# Patient Record
Sex: Female | Born: 1960 | Race: Black or African American | Hispanic: No | Marital: Married | Smoking: Never smoker
Health system: Southern US, Community
[De-identification: ages and names within clinical notes are randomized; demographics above are authoritative.]

## PROBLEM LIST (undated history)

## (undated) DIAGNOSIS — R251 Tremor, unspecified: Secondary | ICD-10-CM

## (undated) DIAGNOSIS — I10 Essential (primary) hypertension: Secondary | ICD-10-CM

## (undated) DIAGNOSIS — E119 Type 2 diabetes mellitus without complications: Secondary | ICD-10-CM

## (undated) HISTORY — PX: CHOLECYSTECTOMY: SHX55

## (undated) HISTORY — PX: TONSILLECTOMY: SUR1361

## (undated) HISTORY — DX: Tremor, unspecified: R25.1

## (undated) HISTORY — PX: BREAST EXCISIONAL BIOPSY: SUR124

## (undated) HISTORY — PX: ABDOMINAL HYSTERECTOMY: SHX81

---

## 2000-02-11 ENCOUNTER — Encounter (INDEPENDENT_AMBULATORY_CARE_PROVIDER_SITE_OTHER): Payer: Self-pay | Admitting: Specialist

## 2000-02-11 ENCOUNTER — Ambulatory Visit (HOSPITAL_COMMUNITY): Admission: RE | Admit: 2000-02-11 | Discharge: 2000-02-11 | Payer: Self-pay | Admitting: *Deleted

## 2000-02-16 ENCOUNTER — Encounter: Payer: Self-pay | Admitting: *Deleted

## 2000-02-16 ENCOUNTER — Encounter: Admission: RE | Admit: 2000-02-16 | Discharge: 2000-02-16 | Payer: Self-pay | Admitting: *Deleted

## 2000-03-15 ENCOUNTER — Encounter: Admission: RE | Admit: 2000-03-15 | Discharge: 2000-03-15 | Payer: Self-pay | Admitting: Nephrology

## 2000-03-15 ENCOUNTER — Encounter: Payer: Self-pay | Admitting: Nephrology

## 2000-04-07 ENCOUNTER — Encounter: Admission: RE | Admit: 2000-04-07 | Discharge: 2000-04-07 | Payer: Self-pay | Admitting: *Deleted

## 2000-04-07 ENCOUNTER — Encounter: Payer: Self-pay | Admitting: *Deleted

## 2000-07-24 ENCOUNTER — Encounter (INDEPENDENT_AMBULATORY_CARE_PROVIDER_SITE_OTHER): Payer: Self-pay | Admitting: Specialist

## 2000-07-24 ENCOUNTER — Ambulatory Visit (HOSPITAL_COMMUNITY): Admission: RE | Admit: 2000-07-24 | Discharge: 2000-07-24 | Payer: Self-pay | Admitting: *Deleted

## 2000-08-14 ENCOUNTER — Encounter: Payer: Self-pay | Admitting: *Deleted

## 2000-08-14 ENCOUNTER — Ambulatory Visit (HOSPITAL_COMMUNITY): Admission: RE | Admit: 2000-08-14 | Discharge: 2000-08-14 | Payer: Self-pay | Admitting: *Deleted

## 2000-11-22 ENCOUNTER — Emergency Department (HOSPITAL_COMMUNITY): Admission: EM | Admit: 2000-11-22 | Discharge: 2000-11-22 | Payer: Self-pay | Admitting: Emergency Medicine

## 2001-06-22 ENCOUNTER — Encounter: Payer: Self-pay | Admitting: Family Medicine

## 2001-06-22 ENCOUNTER — Encounter: Admission: RE | Admit: 2001-06-22 | Discharge: 2001-06-22 | Payer: Self-pay | Admitting: Family Medicine

## 2002-03-06 ENCOUNTER — Encounter (HOSPITAL_BASED_OUTPATIENT_CLINIC_OR_DEPARTMENT_OTHER): Payer: Self-pay | Admitting: General Surgery

## 2002-03-12 ENCOUNTER — Ambulatory Visit (HOSPITAL_COMMUNITY): Admission: RE | Admit: 2002-03-12 | Discharge: 2002-03-12 | Payer: Self-pay | Admitting: General Surgery

## 2002-03-12 ENCOUNTER — Encounter (INDEPENDENT_AMBULATORY_CARE_PROVIDER_SITE_OTHER): Payer: Self-pay | Admitting: *Deleted

## 2002-08-22 ENCOUNTER — Encounter: Payer: Self-pay | Admitting: Family Medicine

## 2002-08-22 ENCOUNTER — Encounter: Admission: RE | Admit: 2002-08-22 | Discharge: 2002-08-22 | Payer: Self-pay | Admitting: Family Medicine

## 2003-09-03 ENCOUNTER — Emergency Department (HOSPITAL_COMMUNITY): Admission: EM | Admit: 2003-09-03 | Discharge: 2003-09-03 | Payer: Self-pay | Admitting: Emergency Medicine

## 2003-09-11 ENCOUNTER — Encounter: Admission: RE | Admit: 2003-09-11 | Discharge: 2003-09-11 | Payer: Self-pay | Admitting: General Surgery

## 2003-11-20 ENCOUNTER — Encounter: Admission: RE | Admit: 2003-11-20 | Discharge: 2003-11-20 | Payer: Self-pay | Admitting: Family Medicine

## 2003-11-20 ENCOUNTER — Other Ambulatory Visit: Admission: RE | Admit: 2003-11-20 | Discharge: 2003-11-20 | Payer: Self-pay | Admitting: *Deleted

## 2003-12-03 ENCOUNTER — Emergency Department (HOSPITAL_COMMUNITY): Admission: EM | Admit: 2003-12-03 | Discharge: 2003-12-03 | Payer: Self-pay | Admitting: Emergency Medicine

## 2004-02-19 ENCOUNTER — Ambulatory Visit: Payer: Self-pay | Admitting: Oncology

## 2004-03-18 ENCOUNTER — Ambulatory Visit (HOSPITAL_COMMUNITY): Admission: RE | Admit: 2004-03-18 | Discharge: 2004-03-18 | Payer: Self-pay | Admitting: Oncology

## 2004-04-19 ENCOUNTER — Inpatient Hospital Stay (HOSPITAL_COMMUNITY): Admission: RE | Admit: 2004-04-19 | Discharge: 2004-04-22 | Payer: Self-pay | Admitting: Obstetrics

## 2004-04-19 ENCOUNTER — Encounter (INDEPENDENT_AMBULATORY_CARE_PROVIDER_SITE_OTHER): Payer: Self-pay | Admitting: Specialist

## 2004-07-07 ENCOUNTER — Ambulatory Visit: Payer: Self-pay | Admitting: Oncology

## 2005-03-09 ENCOUNTER — Ambulatory Visit: Payer: Self-pay | Admitting: Oncology

## 2005-06-03 ENCOUNTER — Encounter: Admission: RE | Admit: 2005-06-03 | Discharge: 2005-06-03 | Payer: Self-pay | Admitting: General Surgery

## 2005-12-06 ENCOUNTER — Ambulatory Visit: Payer: Self-pay | Admitting: Oncology

## 2005-12-08 LAB — CBC WITH DIFFERENTIAL/PLATELET
Basophils Absolute: 0 10*3/uL (ref 0.0–0.1)
EOS%: 1.4 % (ref 0.0–7.0)
Eosinophils Absolute: 0.1 10*3/uL (ref 0.0–0.5)
HGB: 13.3 g/dL (ref 11.6–15.9)
LYMPH%: 25.6 % (ref 14.0–48.0)
MCH: 30.8 pg (ref 26.0–34.0)
MCV: 87.9 fL (ref 81.0–101.0)
MONO%: 6.6 % (ref 0.0–13.0)
NEUT%: 66 % (ref 39.6–76.8)
Platelets: 275 10*3/uL (ref 145–400)
RDW: 13.1 % (ref 11.3–14.5)

## 2005-12-13 LAB — PROTEIN ELECTROPHORESIS, SERUM
Albumin ELP: 47.2 % — ABNORMAL LOW (ref 55.8–66.1)
Alpha-1-Globulin: 4.1 % (ref 2.9–4.9)
Alpha-2-Globulin: 8.5 % (ref 7.1–11.8)
Beta 2: 6.6 % — ABNORMAL HIGH (ref 3.2–6.5)
Gamma Globulin: 27.1 % — ABNORMAL HIGH (ref 11.1–18.8)

## 2005-12-13 LAB — COMPREHENSIVE METABOLIC PANEL
AST: 15 U/L (ref 0–37)
Alkaline Phosphatase: 69 U/L (ref 39–117)
BUN: 16 mg/dL (ref 6–23)
Creatinine, Ser: 0.91 mg/dL (ref 0.40–1.20)
Glucose, Bld: 177 mg/dL — ABNORMAL HIGH (ref 70–99)
Total Bilirubin: 0.4 mg/dL (ref 0.3–1.2)

## 2006-01-31 ENCOUNTER — Ambulatory Visit: Payer: Self-pay | Admitting: Oncology

## 2006-06-20 ENCOUNTER — Encounter: Admission: RE | Admit: 2006-06-20 | Discharge: 2006-06-20 | Payer: Self-pay | Admitting: Oncology

## 2006-07-31 ENCOUNTER — Ambulatory Visit: Payer: Self-pay | Admitting: Oncology

## 2006-08-08 ENCOUNTER — Encounter: Admission: RE | Admit: 2006-08-08 | Discharge: 2006-11-06 | Payer: Self-pay | Admitting: Family Medicine

## 2007-02-09 ENCOUNTER — Emergency Department (HOSPITAL_COMMUNITY): Admission: EM | Admit: 2007-02-09 | Discharge: 2007-02-09 | Payer: Self-pay | Admitting: Emergency Medicine

## 2007-11-04 ENCOUNTER — Emergency Department (HOSPITAL_COMMUNITY): Admission: EM | Admit: 2007-11-04 | Discharge: 2007-11-04 | Payer: Self-pay | Admitting: Family Medicine

## 2008-07-17 ENCOUNTER — Emergency Department (HOSPITAL_COMMUNITY): Admission: EM | Admit: 2008-07-17 | Discharge: 2008-07-17 | Payer: Self-pay | Admitting: Family Medicine

## 2008-11-20 ENCOUNTER — Emergency Department (HOSPITAL_COMMUNITY): Admission: EM | Admit: 2008-11-20 | Discharge: 2008-11-20 | Payer: Self-pay | Admitting: Emergency Medicine

## 2009-01-08 ENCOUNTER — Ambulatory Visit: Payer: Self-pay | Admitting: Oncology

## 2009-02-04 LAB — SPEP & IFE WITH QIG
Albumin ELP: 45 % — ABNORMAL LOW (ref 55.8–66.1)
Alpha-2-Globulin: 9.3 % (ref 7.1–11.8)
Beta 2: 6.6 % — ABNORMAL HIGH (ref 3.2–6.5)
Beta Globulin: 6.7 % (ref 4.7–7.2)
IgA: 502 mg/dL — ABNORMAL HIGH (ref 68–378)
IgG (Immunoglobin G), Serum: 2180 mg/dL — ABNORMAL HIGH (ref 694–1618)

## 2009-02-04 LAB — KAPPA/LAMBDA LIGHT CHAINS
Kappa free light chain: 1.05 mg/dL (ref 0.33–1.94)
Lambda Free Lght Chn: 4.35 mg/dL — ABNORMAL HIGH (ref 0.57–2.63)

## 2009-02-26 ENCOUNTER — Ambulatory Visit: Payer: Self-pay | Admitting: Oncology

## 2009-03-11 LAB — UIFE/LIGHT CHAINS/TP QN, 24-HR UR
Albumin, U: DETECTED
Alpha 1, Urine: DETECTED — AB
Beta, Urine: DETECTED — AB
Free Lambda Excretion/Day: 34.19 mg/d
Gamma Globulin, Urine: DETECTED — AB

## 2009-09-15 ENCOUNTER — Encounter: Admission: RE | Admit: 2009-09-15 | Discharge: 2009-09-15 | Payer: Self-pay | Admitting: Family Medicine

## 2009-12-09 ENCOUNTER — Other Ambulatory Visit: Admission: RE | Admit: 2009-12-09 | Discharge: 2009-12-09 | Payer: Self-pay | Admitting: Family Medicine

## 2010-01-28 ENCOUNTER — Ambulatory Visit: Payer: Self-pay | Admitting: Oncology

## 2010-02-01 LAB — CBC WITH DIFFERENTIAL/PLATELET
BASO%: 0.2 % (ref 0.0–2.0)
EOS%: 2 % (ref 0.0–7.0)
Eosinophils Absolute: 0.2 10*3/uL (ref 0.0–0.5)
HGB: 13.2 g/dL (ref 11.6–15.9)
LYMPH%: 37.2 % (ref 14.0–49.7)
MCH: 30.7 pg (ref 25.1–34.0)
MCHC: 35.3 g/dL (ref 31.5–36.0)
MCV: 87 fL (ref 79.5–101.0)
MONO#: 0.8 10*3/uL (ref 0.1–0.9)
MONO%: 8.8 % (ref 0.0–14.0)
NEUT#: 4.7 10*3/uL (ref 1.5–6.5)
NEUT%: 51.8 % (ref 38.4–76.8)
Platelets: 266 10*3/uL (ref 145–400)
RBC: 4.29 10*6/uL (ref 3.70–5.45)
RDW: 14.5 % (ref 11.2–14.5)
WBC: 9.1 10*3/uL (ref 3.9–10.3)
lymph#: 3.4 10*3/uL — ABNORMAL HIGH (ref 0.9–3.3)

## 2010-02-03 LAB — KAPPA/LAMBDA LIGHT CHAINS
Kappa free light chain: 1.08 mg/dL (ref 0.33–1.94)
Lambda Free Lght Chn: 4.19 mg/dL — ABNORMAL HIGH (ref 0.57–2.63)

## 2010-02-03 LAB — PROTEIN ELECTROPHORESIS, SERUM
Albumin ELP: 45.7 % — ABNORMAL LOW (ref 55.8–66.1)
Alpha-1-Globulin: 6.5 % — ABNORMAL HIGH (ref 2.9–4.9)
Alpha-2-Globulin: 8.5 % (ref 7.1–11.8)
Beta 2: 5.2 % (ref 3.2–6.5)
Beta Globulin: 6.4 % (ref 4.7–7.2)
M-Spike, %: 1.63 g/dL

## 2010-07-17 LAB — DIFFERENTIAL
Basophils Absolute: 0.1 10*3/uL (ref 0.0–0.1)
Basophils Relative: 1 % (ref 0–1)
Eosinophils Absolute: 0.1 10*3/uL (ref 0.0–0.7)
Eosinophils Relative: 1 % (ref 0–5)
Lymphocytes Relative: 37 % (ref 12–46)
Lymphs Abs: 3.7 10*3/uL (ref 0.7–4.0)
Monocytes Absolute: 0.5 10*3/uL (ref 0.1–1.0)
Monocytes Relative: 5 % (ref 3–12)
Neutro Abs: 5.5 10*3/uL (ref 1.7–7.7)
Neutrophils Relative %: 55 % (ref 43–77)

## 2010-07-17 LAB — URINALYSIS, ROUTINE W REFLEX MICROSCOPIC
Bilirubin Urine: NEGATIVE
Glucose, UA: >1000 mg/dL — AB
Hgb urine dipstick: NEGATIVE
Ketones, ur: NEGATIVE mg/dL
Leukocytes, UA: NEGATIVE
Nitrite: NEGATIVE
Protein, ur: 100 mg/dL — AB
Specific Gravity, Urine: 1.039 — ABNORMAL HIGH (ref 1.005–1.030)
Urobilinogen, UA: 0.2 mg/dL (ref 0.0–1.0)
pH: 6.5 (ref 5.0–8.0)

## 2010-07-17 LAB — COMPREHENSIVE METABOLIC PANEL
ALT: 26 U/L (ref 0–35)
AST: 21 U/L (ref 0–37)
Albumin: 3.2 g/dL — ABNORMAL LOW (ref 3.5–5.2)
Alkaline Phosphatase: 102 U/L (ref 39–117)
BUN: 12 mg/dL (ref 6–23)
CO2: 26 mEq/L (ref 19–32)
Calcium: 8.6 mg/dL (ref 8.4–10.5)
Chloride: 101 mEq/L (ref 96–112)
Creatinine, Ser: 0.72 mg/dL (ref 0.4–1.2)
GFR calc Af Amer: >60 mL/min (ref >60)
GFR calc non Af Amer: >60 mL/min (ref >60)
Glucose, Bld: 349 mg/dL — ABNORMAL HIGH (ref 70–99)
Potassium: 3.6 mEq/L (ref 3.5–5.1)
Sodium: 133 mEq/L — ABNORMAL LOW (ref 135–145)
Total Bilirubin: 0.6 mg/dL (ref 0.3–1.2)
Total Protein: 7.6 g/dL (ref 6.0–8.3)

## 2010-07-17 LAB — URINE MICROSCOPIC-ADD ON

## 2010-07-17 LAB — CBC
HCT: 43.5 % (ref 36.0–46.0)
Hemoglobin: 15.3 g/dL — ABNORMAL HIGH (ref 12.0–15.0)
MCHC: 35.2 g/dL (ref 30.0–36.0)
MCV: 86.4 fL (ref 78.0–100.0)
Platelets: 213 10*3/uL (ref 150–400)
RBC: 5.04 MIL/uL (ref 3.87–5.11)
RDW: 13.3 % (ref 11.5–15.5)
WBC: 10.1 10*3/uL (ref 4.0–10.5)

## 2010-07-17 LAB — GLUCOSE, CAPILLARY
Glucose-Capillary: 340 mg/dL — ABNORMAL HIGH (ref 70–99)
Glucose-Capillary: 376 mg/dL — ABNORMAL HIGH (ref 70–99)

## 2010-08-18 ENCOUNTER — Emergency Department (HOSPITAL_BASED_OUTPATIENT_CLINIC_OR_DEPARTMENT_OTHER)
Admission: EM | Admit: 2010-08-18 | Discharge: 2010-08-18 | Disposition: A | Discharge status: Other Acute Inpatient Hospital | Payer: Managed Care, Other (non HMO) | Attending: Emergency Medicine | Admitting: Emergency Medicine

## 2010-08-18 ENCOUNTER — Emergency Department (HOSPITAL_COMMUNITY)
Admission: EM | Admit: 2010-08-18 | Discharge: 2010-08-19 | Disposition: A | Discharge status: Home / Self Care | Payer: Managed Care, Other (non HMO) | Attending: Emergency Medicine | Admitting: Emergency Medicine

## 2010-08-18 ENCOUNTER — Emergency Department (INDEPENDENT_AMBULATORY_CARE_PROVIDER_SITE_OTHER): Payer: Managed Care, Other (non HMO)

## 2010-08-18 DIAGNOSIS — E119 Type 2 diabetes mellitus without complications: Secondary | ICD-10-CM | POA: Insufficient documentation

## 2010-08-18 DIAGNOSIS — R079 Chest pain, unspecified: Secondary | ICD-10-CM | POA: Insufficient documentation

## 2010-08-18 DIAGNOSIS — I10 Essential (primary) hypertension: Secondary | ICD-10-CM | POA: Insufficient documentation

## 2010-08-18 DIAGNOSIS — Z79899 Other long term (current) drug therapy: Secondary | ICD-10-CM | POA: Insufficient documentation

## 2010-08-18 LAB — CBC
HCT: 36.2 % (ref 36.0–46.0)
Hemoglobin: 13.3 g/dL (ref 12.0–15.0)
MCH: 29.2 pg (ref 26.0–34.0)
MCHC: 36.7 g/dL — ABNORMAL HIGH (ref 30.0–36.0)
MCV: 79.6 fL (ref 78.0–100.0)
Platelets: 293 10*3/uL (ref 150–400)
RBC: 4.55 MIL/uL (ref 3.87–5.11)
RDW: 13.8 % (ref 11.5–15.5)

## 2010-08-18 LAB — DIFFERENTIAL
Basophils Absolute: 0 10*3/uL (ref 0.0–0.1)
Eosinophils Absolute: 0.2 10*3/uL (ref 0.0–0.7)
Eosinophils Relative: 2 % (ref 0–5)
Lymphocytes Relative: 33 % (ref 12–46)
Lymphs Abs: 3.1 10*3/uL (ref 0.7–4.0)
Monocytes Absolute: 1 10*3/uL (ref 0.1–1.0)
Monocytes Relative: 10 % (ref 3–12)
Neutro Abs: 5.3 10*3/uL (ref 1.7–7.7)
Neutrophils Relative %: 56 % (ref 43–77)

## 2010-08-18 LAB — CK TOTAL AND CKMB (NOT AT ARMC)
CK, MB: 2.3 ng/mL (ref 0.3–4.0)
CK, MB: 2.3 ng/mL (ref 0.3–4.0)
Relative Index: 2 (ref 0.0–2.5)
Relative Index: 2.2 (ref 0.0–2.5)
Total CK: 116 U/L (ref 7–177)

## 2010-08-18 LAB — BASIC METABOLIC PANEL
BUN: 13 mg/dL (ref 6–23)
CO2: 25 mEq/L (ref 19–32)
Calcium: 9.9 mg/dL (ref 8.4–10.5)
Chloride: 97 mEq/L (ref 96–112)
Creatinine, Ser: 0.8 mg/dL (ref 0.4–1.2)
GFR calc Af Amer: >60 mL/min (ref >60)
GFR calc non Af Amer: >60 mL/min (ref >60)
Glucose, Bld: 169 mg/dL — ABNORMAL HIGH (ref 70–99)
Potassium: 3.4 mEq/L — ABNORMAL LOW (ref 3.5–5.1)
Sodium: 136 mEq/L (ref 135–145)

## 2010-08-18 LAB — D-DIMER, QUANTITATIVE: D-Dimer, Quant: <0.22 ug/mL-FEU (ref 0.00–0.48)

## 2010-08-18 LAB — TROPONIN I: Troponin I: <0.3 ng/mL (ref <0.30)

## 2010-08-19 DIAGNOSIS — R072 Precordial pain: Secondary | ICD-10-CM

## 2010-08-19 LAB — CK TOTAL AND CKMB (NOT AT ARMC)
CK, MB: 2.1 ng/mL (ref 0.3–4.0)
Total CK: 105 U/L (ref 7–177)

## 2010-08-27 NOTE — Discharge Summary (Signed)
Courtney Cabrera, Courtney Cabrera          ACCOUNT NO.:  [id number]   MEDICAL RECORD NO.:  [id number]          PATIENT TYPE:  INP   LOCATION:  9308                          FACILITY:  [facility]   PHYSICIAN:  Roseanna Rainbow, M.D.DATE OF BIRTH:  Nov 28, 1960   DATE OF ADMISSION:  04/19/2004  DATE OF DISCHARGE:  04/22/2004                                 DISCHARGE SUMMARY   CHIEF COMPLAINT:  The patient is a 50 year old African-American female, para  2, with dysmenorrhea, menorrhagia, for a total abdominal hysterectomy.   HISTORY OF PRESENT ILLNESS:  The patient gives a history of the above  complaints for approximately one year.  She also has a history of abnormal  Pap smear.  Her symptoms have been resistant to attempts at medical  management.   ALLERGIES:  No known drug allergies.   MEDICATIONS:  1.  Diovan.  2.  Ibuprofen.  3.  Tylenol.   PAST SURGICAL HISTORY:  Cesarean delivery x2.  Bilateral tubal ligation.  Cholecystectomy.   PAST MEDICAL HISTORY:  Chronic hypertension.   FAMILY HISTORY:  Coronary vascular disease, CVA, breast cancer.   SOCIAL HISTORY:  She is married.  She denies any tobacco, ethanol, or  recreational drug use.   PHYSICAL EXAMINATION:  VITAL SIGNS:  Temperature 97.6, pulse 86, respiratory  rate 16, blood pressure 151/86, weight 214 pounds, height 5 feet 1 inch.  GENERAL:  Well-nourished, well-developed, African-American female in no  acute distress.  HEENT:  Normocephalic and atraumatic.  NECK:  Supple, no adenopathy.  LUNGS:  Clear to auscultation bilaterally.  HEART:  Regular rate and rhythm.  BREASTS:  Soft, no masses, nontender.  ABDOMEN:  Obese, nontender, no masses. Adenopathy; none.  PELVIC:  External female genitalia normal appearing.  Speculum examination;  the cervix is nulliparous, no lesions.  The vagina is clean.  On bimanual  examination; the uterus is 10 to 12 weeks aggregate size, midposition,  tender to palpation. The adnexa; no masses,  nontender.  EXTREMITIES:  No cyanosis, clubbing, or edema.  SKIN:  Without rash.   ASSESSMENT:  Symptomatic uterine fibroids.   PLAN:  Total abdominal hysterectomy.   HOSPITAL COURSE:  The patient was admitted and underwent a total abdominal  hysterectomy with lysis of adhesions.  Please see the dictated operative  summary for further details.  Her postoperative course was uneventful.  She  was discharged to home on postoperative day #3 tolerating a regular diet.   DISCHARGE DIAGNOSES:  1.  Endometrial polyp.  2.  Uterine fibroids.   PROCEDURE:  Total abdominal hysterectomy and lysis of adhesions.   CONDITION ON DISCHARGE:  Good.   DISCHARGE MEDICATIONS:  1.  Ibuprofen.  2.  Darvocet one to two tablets every four to six hours as needed.   ACTIVITY:  Pelvic rest, no strenuous activity.   DIET:  Regular.   DISPOSITION:  The patient is to follow up in several days for staple  removal.      LAJ/MEDQ  D:  06/01/2004  T:  06/01/2004  Job:  [id number]

## 2010-08-27 NOTE — Op Note (Signed)
   Courtney Cabrera, Courtney Cabrera                    ACCOUNT NO.:  [id number]   MEDICAL RECORD NO.:  [id number]                   PATIENT TYPE:  OIB   LOCATION:  2874                                 FACILITY:  [facility]   PHYSICIAN:  Leonie Man, M.D.                DATE OF BIRTH:  Sep 30, 1960   DATE OF PROCEDURE:  03/12/2002  DATE OF DISCHARGE:                                 OPERATIVE REPORT   PREOPERATIVE DIAGNOSIS:  Right breast mass, rule out carcinoma.   POSTOPERATIVE DIAGNOSIS:  Right breast mass, rule out carcinoma, pathology  pending.   OPERATION PERFORMED:  Excisional biopsy right breast mass.   SURGEON:  Leonie Man, M.D.   ASSISTANT:  Nurse.   ANESTHESIA:  General.   INDICATIONS FOR PROCEDURE:  The patient is a 50 year old woman with a  persistent right breast mass that is located at the anterior axillary line  which does not image on ultrasound or on mammogram.  It has been persistent  for some time.  The patient wishes to have this removed.  She comes to the  operating room  now after the risks and potential benefits of surgery have  been fully discussed with her, all questions answered and consent obtained.   DESCRIPTION OF PROCEDURE:  Following the induction of satisfactory general  anesthesia, the patient was positioned supinely.  The right breast was  prepped and draped to be included in a sterile operative field.  The region  of the anterior axillary line of the right breast near the axillary tail was  then palpated.  The entire circumareolar incision was carried down through  the skin and subcutaneous tissues, deepened to the region of the mass.  The  mass was excised in its entirety, carrying the entire excision all the way  down to the chest wall.  This was removed and forwarded for pathologic  evaluation.  Hemostasis was obtained with electrocautery.  Sponge and  instrument counts were verified.  The subcutaneous tissues were closed with  interrupted 3-0  Vicryl sutures and the skin was closed with a running 5-0  Monocryl, then reinforced with Steri-Strips.  Sterile dressings were  applied.  Anesthetic was reversed and the patient removed from the operating  room to the recovery room in stable condition, having tolerated the  procedure well.                                                Leonie Man, M.D.    PB/MEDQ  D:  03/12/2002  T:  03/12/2002  Job:  [id number]

## 2010-08-27 NOTE — Op Note (Signed)
NAMEDONATA, Courtney Cabrera          ACCOUNT NO.:  [id number]   MEDICAL RECORD NO.:  [id number]          PATIENT TYPE:  INP   LOCATION:  9399                          FACILITY:  [facility]   PHYSICIAN:  Charles A. Clearance Cabrera, M.D.DATE OF BIRTH:  1960/04/30   DATE OF PROCEDURE:  04/19/2004  DATE OF DISCHARGE:                                 OPERATIVE REPORT   PREOPERATIVE DIAGNOSIS:  Symptomatic uterine fibroids.   POSTOPERATIVE DIAGNOSIS:  Symptomatic uterine fibroids.   PROCEDURE:  1.  Total abdominal hysterectomy.  2.  Lysis of multiple pelvic adhesions.   SURGEON:  Coral Ceo, M.D.   ASSISTANT:  Antionette Char, M.D.   ANESTHESIA:  General.   ESTIMATED BLOOD LOSS:  250 mL.   IV FLUIDS:  2000 mL.   URINE OUT:  150 mL clear.   COMPLICATIONS:  None.  Foley to gravity.   SPECIMENS:  Uterus with cervix.   FINDINGS:  Multiple adhesions between uterus and peritoneum.  Fibroid  uterus.   OPERATION:  The patient was brought to the operating room.  After  satisfactory general endotracheal anesthesia, the abdomen was prepped and  draped in the usual sterile fashion.  A midline vertical skin incision was  made with the scalpel through the previous incision down to the fascia.  Fascia was nicked in the midline, and the fascial incision was extended  superiorly and inferiorly, sharply with the scalpel.  The center of the  rectus muscle was then sharply dissected off of the fascia, and the rectus  muscle was divided in the midline sharply.  The peritoneum was entered, and  the peritoneum was extended superiorly and inferiorly being careful to avoid  the urinary bladder anteriorly.  A dense adhesion was noted between the  fundus of the uterus and the fold of peritoneum above the reflection of the  urinary bladder into the peritoneum.  This was sharply dissected close to  the uterus, and the peritoneum was dropped away from the fundus of the  uterus with sharp dissection.  The cornu of  the uterus along with the broad  and round ligaments were then grasped with Kelly forceps, and the uterus was  exteriorized after placement of the O'Connor-O'Sullivan retractor and  packing off of the bowel.  The anterior and posterior blades were also  placed, and then the long Kelly forceps were placed across the uteroovarian  and broad ligaments.  The round ligaments were transected bilaterally and  suture ligated with 0 Vicryl suture, and the anterior reflection of the  peritoneum was then transected, and the urinary bladder was dropped away  from the anterior lower uterine segment and the cervix.  The urinary bladder  was sharply dissected away from the cervix and pushed down away from the  operative field.  The peritoneum was then dissected sharply posteriorly back  to the uterosacral ligament.  A window was tented medially in the broad  ligament, and parametrial clamps were placed across the broad and  uteroovarian ligaments bilaterally, and these were transected and free tied  with 0 Vicryl to place beneath the clamp followed by transfixion suture of 0  Vicryl.  The uterine vessels were then skeletonized and cross-clamped at  right angle with parametrial clamps followed by straight parametrial clamp  above to prevent back flow.  The uterine vessels were then transected and  suture ligated with transfixion sutures of 0 Vicryl bilaterally.  The  cardinal ligaments were then clamped, transected, and suture ligated with  transfixion sutures of 0 Vicryl down to the uterosacral ligament.  The  uterosacral ligaments were cross-clamped, transected, and suture ligated  with transfixion sutures of 0 Vicryl bilaterally.  The curved parametrial  clamps were then placed across the vaginal cuff and met in the center, and  the cervix along with the uterus was then transected away from the vaginal  cuff and submitted to pathology for evaluation.  The corners of the vaginal  cuff were then suture  ligated with transfixion sutures of 0 Vicryl.  The  center of the vaginal cuff was closed with a figure-of-eight suture of 0  Vicryl.  Hemostasis was excellent.  The pelvic cavity was then thoroughly  irrigated with warm saline solution, and all pedicles were examined for  hemostasis, and there was no active bleeding noted.  The retractor was then  removed, and all packing was removed.  The surgical technician indicated  that counts were correct at this time.  The abdomen was then closes as  follows:  The fascia and peritoneum were closed as one with a continuous  suture of 0 PDS from each corner to the center; subcutaneous tissue was  thoroughly irrigated with warm saline solution, and all areas of  subcutaneous bleeding were coagulated with the Bovie.  The skin was then  closed with stainless steel staples.  Sterile bandages applied to the  incision closure.  The surgical technician indicated that needle, sponge,  and instrument counts were correct x 2.  The patient tolerated the procedure  well and was transported to the recovery room in satisfactory condition.                                               ______________________________  Courtney Cabrera, M.D.  Electronically Signed 05/03/2004 10:02:33 EST    CAH/MEDQ  D:  04/19/2004  T:  04/19/2004  Job:  [id number]

## 2011-04-25 ENCOUNTER — Telehealth: Payer: Self-pay | Admitting: Oncology

## 2011-04-25 NOTE — Telephone Encounter (Signed)
S/w the pt and she wants to see her regular doctor first to see if she  If she still needs to see dr Truett Perna before setting her up for an appt

## 2011-06-17 ENCOUNTER — Other Ambulatory Visit: Payer: Managed Care, Other (non HMO) | Admitting: Lab

## 2011-06-17 ENCOUNTER — Ambulatory Visit: Payer: Managed Care, Other (non HMO) | Admitting: Oncology

## 2012-07-02 ENCOUNTER — Encounter (HOSPITAL_COMMUNITY): Payer: Self-pay | Admitting: Emergency Medicine

## 2012-07-02 ENCOUNTER — Emergency Department (HOSPITAL_COMMUNITY)
Admission: EM | Admit: 2012-07-02 | Discharge: 2012-07-02 | Disposition: A | Discharge status: Home / Self Care | Payer: Managed Care, Other (non HMO) | Source: Home / Self Care | Attending: Family Medicine | Admitting: Family Medicine

## 2012-07-02 DIAGNOSIS — E162 Hypoglycemia, unspecified: Secondary | ICD-10-CM

## 2012-07-02 HISTORY — DX: Type 2 diabetes mellitus without complications: E11.9

## 2012-07-02 HISTORY — DX: Essential (primary) hypertension: I10

## 2012-07-02 NOTE — ED Provider Notes (Signed)
History     CSN: [id number]  Arrival date & time 07/02/12  1134   First MD Initiated Contact with Patient 07/02/12 1456      Chief Complaint  Patient presents with  . Hypertension    (Consider location/radiation/quality/duration/timing/severity/associated sxs/prior treatment) HPI Comments: Pt reports that for last 2 months has been having episodes of hypoglycemia.  Had 2 in last 3 days.  They happen mostly in the morning and are relieved by eating.  Pt reports she eats breakfast before taking meds and hasn't changed diet lately. No recent change in dose of meds. Has been trying to exercise more often.  Fasting blood sugars are 110-129 at the highest.  During today's hypoglycemic episode, ems was called and found her bp to be 200/150 despite pt having taken her meds today.  Pt feels significantly better now.   Patient is a 52 y.o. female presenting with hypertension. The history is provided by the patient and the spouse.  Hypertension This is a chronic problem. Episode onset: increased bp this morning at work. The problem occurs constantly. Progression since onset: higher than usual this morning. Pertinent negatives include no chest pain, no abdominal pain and no headaches. Nothing aggravates the symptoms. The symptoms are relieved by medications. The treatment provided significant relief.    Past Medical History  Diagnosis Date  . Diabetes mellitus without complication   . Hypertension     History reviewed. No pertinent past surgical history.  History reviewed. No pertinent family history.  History  Substance Use Topics  . Smoking status: Never Smoker   . Smokeless tobacco: Not on file  . Alcohol Use: No    OB History   Grav Para Term Preterm Abortions TAB SAB Ect Mult Living                  Review of Systems  Constitutional: Positive for activity change. Negative for fever, chills, diaphoresis and appetite change.       Has increased physical activity   Cardiovascular: Negative for chest pain.  Gastrointestinal: Negative for abdominal pain.  Endocrine:       Hypoglycemia this morning  Neurological: Negative for headaches.    Allergies  Review of patient's allergies indicates no known allergies.  Home Medications   Current Outpatient Rx  Name  Route  Sig  Dispense  Refill  . cholecalciferol (VITAMIN D) 1000 UNITS tablet   Oral   Take 1,000 Units by mouth daily.         . ferrous sulfate 325 (65 FE) MG tablet   Oral   Take 325 mg by mouth daily with breakfast.         . glimepiride (AMARYL) 4 MG tablet   Oral   Take 4 mg by mouth daily before breakfast.         . metFORMIN (GLUCOPHAGE) 1000 MG tablet   Oral   Take 1,000 mg by mouth 2 (two) times daily with a meal.         . Multiple Vitamins-Minerals (MULTIVITAMIN WITH MINERALS) tablet   Oral   Take 1 tablet by mouth daily.         . valsartan (DIOVAN) 160 MG tablet   Oral   Take 160 mg by mouth daily.         . vitamin B-12 (CYANOCOBALAMIN) 100 MCG tablet   Oral   Take 50 mcg by mouth daily.           BP 166/89  Pulse  77  Temp(Src) 98.5 F (36.9 C) (Oral)  Resp 22  SpO2 100%  Physical Exam  Constitutional: She is oriented to person, place, and time. She appears well-developed and well-nourished. No distress.  Cardiovascular: Normal rate and regular rhythm.   Pulmonary/Chest: Effort normal and breath sounds normal.  Neurological: She is alert and oriented to person, place, and time.    ED Course  Procedures (including critical care time)  Labs Reviewed - No data to display No results found.   1. Hypoglycemia       MDM  bp here in [facility] elevated but not critical.  Pt to f/u with pcp, in the meantime reduced glimepiride dose.  Pt reports taking metformin 3 pills twice a day, and glimepiride 2 pills every morning.  Pt to cut glimepiride dose in half and take only one every morning.  Pt to monitor bp and blood sugar and take to pcp on  f/u.          Cathlyn Parsons, NP 07/02/12 1506

## 2012-07-02 NOTE — ED Notes (Addendum)
Pt c/o high blood pressure this morning at work. Felt hypoglecemic. Called EMS and BP was 200/150. Took all medications this morning as scheduled. Blood sugar was 113 this morning, then 95 on recheck. No light headedness. No headaches. Felt jittery. Patient is alert and oriented. No signs of distress.

## 2012-07-04 NOTE — ED Provider Notes (Signed)
Medical screening examination/treatment/procedure(s) were performed by resident physician or non-physician practitioner and as supervising physician I was immediately available for consultation/collaboration.   Kipton Skillen DOUGLAS MD.   Le Fer[place] D Alina Gilkey, MD 07/04/12 2046 

## 2012-12-06 ENCOUNTER — Emergency Department (HOSPITAL_COMMUNITY)
Admission: EM | Admit: 2012-12-06 | Discharge: 2012-12-06 | Disposition: A | Discharge status: Home / Self Care | Payer: Managed Care, Other (non HMO) | Attending: Emergency Medicine | Admitting: Emergency Medicine

## 2012-12-06 ENCOUNTER — Encounter (HOSPITAL_COMMUNITY): Payer: Self-pay | Admitting: Emergency Medicine

## 2012-12-06 ENCOUNTER — Emergency Department (HOSPITAL_COMMUNITY)
Admission: EM | Admit: 2012-12-06 | Discharge: 2012-12-06 | Disposition: A | Discharge status: Hospice | Payer: Managed Care, Other (non HMO) | Source: Home / Self Care

## 2012-12-06 ENCOUNTER — Encounter (HOSPITAL_COMMUNITY): Payer: Self-pay | Admitting: *Deleted

## 2012-12-06 DIAGNOSIS — I10 Essential (primary) hypertension: Secondary | ICD-10-CM

## 2012-12-06 DIAGNOSIS — R209 Unspecified disturbances of skin sensation: Secondary | ICD-10-CM | POA: Insufficient documentation

## 2012-12-06 DIAGNOSIS — R42 Dizziness and giddiness: Secondary | ICD-10-CM | POA: Insufficient documentation

## 2012-12-06 DIAGNOSIS — E119 Type 2 diabetes mellitus without complications: Secondary | ICD-10-CM | POA: Insufficient documentation

## 2012-12-06 DIAGNOSIS — R51 Headache: Secondary | ICD-10-CM | POA: Insufficient documentation

## 2012-12-06 LAB — URINALYSIS, ROUTINE W REFLEX MICROSCOPIC
Glucose, UA: NEGATIVE mg/dL
Hgb urine dipstick: NEGATIVE
Specific Gravity, Urine: 1.008 (ref 1.005–1.030)
Urobilinogen, UA: 0.2 mg/dL (ref 0.0–1.0)
pH: 6 (ref 5.0–8.0)

## 2012-12-06 LAB — COMPREHENSIVE METABOLIC PANEL
ALT: 29 U/L (ref 0–35)
AST: 22 U/L (ref 0–37)
Alkaline Phosphatase: 88 U/L (ref 39–117)
Calcium: 9.7 mg/dL (ref 8.4–10.5)
Potassium: 3.2 mEq/L — ABNORMAL LOW (ref 3.5–5.1)
Sodium: 137 mEq/L (ref 135–145)
Total Protein: 8.9 g/dL — ABNORMAL HIGH (ref 6.0–8.3)

## 2012-12-06 LAB — CBC
HCT: 37.9 % (ref 36.0–46.0)
Hemoglobin: 13.8 g/dL (ref 12.0–15.0)
MCH: 29.4 pg (ref 26.0–34.0)
MCHC: 36.4 g/dL — ABNORMAL HIGH (ref 30.0–36.0)
MCV: 80.8 fL (ref 78.0–100.0)

## 2012-12-06 NOTE — ED Provider Notes (Signed)
CSN: [id number]     Arrival date & time 12/06/12  1328 History   First MD Initiated Contact with Patient 12/06/12 1458     Chief Complaint  Patient presents with  . Hypertension   (Consider location/radiation/quality/duration/timing/severity/associated sxs/prior Treatment) HPI Comments: 52 year old female with a history of hypertension and diabetes. She has been monitoring her blood pressures at home and say they have consistently been elevating. She call her PCP yesterday and was given hydrochlorothiazide 12.5 mg. She has taken one dose. She continues to monitor her blood pressure has noticed that her diastolic continues to rise. She did not call her PCP back today. She is complaining of a throbbing headache, dizziness, nausea prior to arrival and numbness in the left arm. Her expectations are that we managed her blood pressure lowering in the urgent care.   Past Medical History  Diagnosis Date  . Diabetes mellitus without complication   . Hypertension    History reviewed. No pertinent past surgical history. History reviewed. No pertinent family history. History  Substance Use Topics  . Smoking status: Never Smoker   . Smokeless tobacco: Not on file  . Alcohol Use: No   OB History   Grav Para Term Preterm Abortions TAB SAB Ect Mult Living                 Review of Systems  Constitutional: Positive for activity change. Negative for fever.  HENT: Negative for congestion, sore throat, trouble swallowing, neck pain, postnasal drip and sinus pressure.   Respiratory: Negative.   Cardiovascular: Negative.   Gastrointestinal: Positive for nausea. Negative for vomiting and abdominal pain.  Neurological: Positive for dizziness, numbness and headaches.    Allergies  Review of patient's allergies indicates no known allergies.  Home Medications   Current Outpatient Rx  Name  Route  Sig  Dispense  Refill  . cholecalciferol (VITAMIN D) 1000 UNITS tablet   Oral   Take 1,000 Units by  mouth daily.         . ferrous sulfate 325 (65 FE) MG tablet   Oral   Take 325 mg by mouth daily with breakfast.         . glimepiride (AMARYL) 4 MG tablet   Oral   Take 4 mg by mouth daily before breakfast.         . metFORMIN (GLUCOPHAGE) 1000 MG tablet   Oral   Take 1,000 mg by mouth 2 (two) times daily with a meal.         . Multiple Vitamins-Minerals (MULTIVITAMIN WITH MINERALS) tablet   Oral   Take 1 tablet by mouth daily.         . valsartan (DIOVAN) 160 MG tablet   Oral   Take 160 mg by mouth daily.         . vitamin B-12 (CYANOCOBALAMIN) 100 MCG tablet   Oral   Take 50 mcg by mouth daily.          BP 164/107  Pulse 74  Temp(Src) 98.1 F (36.7 C) (Oral)  Resp 18  SpO2 98% Physical Exam  Nursing note and vitals reviewed. Constitutional: She is oriented to person, place, and time. She appears well-developed and well-nourished. No distress.  Eyes: Conjunctivae and EOM are normal. Pupils are equal, round, and reactive to light.  Neck: Normal range of motion. Neck supple.  Cardiovascular: Normal rate, regular rhythm and normal heart sounds.   Pulmonary/Chest: Effort normal and breath sounds normal. No respiratory distress. She has  no wheezes. She has no rales.  Musculoskeletal: She exhibits no edema.  Neurological: She is alert and oriented to person, place, and time. No cranial nerve deficit.  Skin: Skin is warm and dry.  Psychiatric: She has a normal mood and affect.    ED Course  Procedures (including critical care time) Labs Review Labs Reviewed - No data to display Imaging Review No results found.  MDM   1. Hypertension, poor control    Transfer to the ED for uncontrolled HTN assoc with nausea, headache and L arm numbness. Pt presented to Urgent Care for HTN management and lowering of BP. Would need to be monitored over a period of time.    Hayden Rasmussen, NP 12/06/12 1555

## 2012-12-06 NOTE — ED Provider Notes (Signed)
TIME SEEN: 5:05 PM  CHIEF COMPLAINT: Hypertension, headache, dizziness  HPI: Patient is a 52 year old female with a history of hypertension, diabetes who presents the emergency department as a transfer from urgent care for evaluation for hypertension. Patient reports that since July she has had difficulty controlling her blood pressure. She is currently on Diovan/hydrochlorothiazide 160 mg/12.5 mg and verapamil extended release 120 mg. She was seen by her primary care physician recently and was added another dose of 12.5 mg of hydrochlorothiazide. She states that she has intermittent posterior throbbing, gradual onset headaches. No vision changes. No chest pain or shortness of breath. She states that time she does so lightheaded this only last for several seconds and then resolves. She complains that she has some posterior upper left arm tingling for the past week it is also been intermittent. It does not involve her entire arm. It occurs with sleeping.  She reports feeling better in the emergency department and is denying any symptoms. On my evaluation, patient's blood pressure is 154/98.  ROS: See HPI Constitutional: no fever  Eyes: no drainage  ENT: no runny nose   Cardiovascular:  no chest pain  Resp: no SOB  GI: no vomiting GU: no dysuria Integumentary: no rash  Allergy: no hives  Musculoskeletal: no leg swelling  Neurological: no slurred speech ROS otherwise negative  PAST MEDICAL HISTORY/PAST SURGICAL HISTORY:  Past Medical History  Diagnosis Date  . Diabetes mellitus without complication   . Hypertension     MEDICATIONS:  Prior to Admission medications   Medication Sig Start Date End Date Taking? Authorizing Provider  cholecalciferol (VITAMIN D) 1000 UNITS tablet Take 1,000 Units by mouth daily.    Historical Provider, MD  ferrous sulfate 325 (65 FE) MG tablet Take 325 mg by mouth daily with breakfast.    Historical Provider, MD  glimepiride (AMARYL) 4 MG tablet Take 4 mg by  mouth daily before breakfast.    Historical Provider, MD  metFORMIN (GLUCOPHAGE) 1000 MG tablet Take 1,000 mg by mouth 2 (two) times daily with a meal.    Historical Provider, MD  Multiple Vitamins-Minerals (MULTIVITAMIN WITH MINERALS) tablet Take 1 tablet by mouth daily.    Historical Provider, MD  valsartan (DIOVAN) 160 MG tablet Take 160 mg by mouth daily.    Historical Provider, MD  vitamin B-12 (CYANOCOBALAMIN) 100 MCG tablet Take 50 mcg by mouth daily.    Historical Provider, MD    ALLERGIES:  Allergies  Allergen Reactions  . Amlodipine     SOCIAL HISTORY:  History  Substance Use Topics  . Smoking status: Never Smoker   . Smokeless tobacco: Not on file  . Alcohol Use: No    FAMILY HISTORY: History reviewed. No pertinent family history.  EXAM: BP 205/107  Pulse 87  Temp(Src) 98.6 F (37 C) (Oral)  Resp 20  SpO2 98% CONSTITUTIONAL: Alert and oriented and responds appropriately to questions. Well-appearing; well-nourished HEAD: Normocephalic EYES: Conjunctivae clear, PERRL ENT: normal nose; no rhinorrhea; moist mucous membranes; pharynx without lesions noted NECK: Supple, no meningismus, no LAD  CARD: RRR; S1 and S2 appreciated; no murmurs, no clicks, no rubs, no gallops RESP: Normal chest excursion without splinting or tachypnea; breath sounds clear and equal bilaterally; no wheezes, no rhonchi, no rales,  ABD/GI: Normal bowel sounds; non-distended; soft, non-tender, no rebound, no guarding BACK:  The back appears normal and is non-tender to palpation, there is no CVA tenderness EXT: Normal ROM in all joints; non-tender to palpation; no edema; normal capillary  refill; no cyanosis    SKIN: Normal color for age and race; warm NEURO: Moves all extremities equally, strength 5/5 in all 4 extremity, cranial nerves II through XII intact, sensation to light touch intact diffusely, no dysmetria to finger-nose testing, normal deep PSYCH: The patient's mood and manner are  appropriate. Grooming and personal hygiene are appropriate.  MEDICAL DECISION MAKING: Patient with hypertension with gradual onset headache and lightheadedness has been intermittent for the past 2 months. She is currently symptom free and her blood pressure is unremarkable here in the ED. I do not feel she needs to have any further agent added on at this time given her blood pressure is controlled. As for her left upper extremity tingling, given this is only in her posterior left upper arm, I am not concerned for any intracranial cause but her symptoms seemed more related to neuropraxia given they occur when sleeping. We'll check basic labs today. I do not feel she needs a CT of her head at this time as my concern for intracranial hemorrhage is very low. Patient agrees with this plan and is comfortable with discharge home with outpatient followup.  ED PROGRESS: Labs and urine unremarkable. Blood pressure is now in the 140s/60s without further intervention. Patient still asymptomatic. We'll discharge him with close outpatient followup. Discussed customary in usual return precautions. Patient verbalizes understanding is comfortable with this plan.  Layla Maw Gayleen Sholtz, DO 12/06/12 1901

## 2012-12-06 NOTE — ED Notes (Signed)
C/o bp is high.  Patient was seen yesterday at PCP and was given HCTZ to take with her other medications she takes for her BP.  Patient states she has headaches and nausea due to bp is high.

## 2012-12-06 NOTE — ED Notes (Addendum)
Pt sent here from UC for htn.  Pt has hx of htn that is elevating.  Her pcp started her on HCTZ today.  After taking the medication, pt began experiencing intermittant headaches and nausea (pt is presently pain free).  Pt also c/o L arm tingling x 1 week. Denies blurred vision.

## 2012-12-08 NOTE — ED Provider Notes (Signed)
Medical screening examination/treatment/procedure(s) were performed by a resident physician or non-physician practitioner and as the supervising physician I was immediately available for consultation/collaboration.  Clementeen Graham, MD   Rodolph Bong, MD 12/08/12 [phone]

## 2013-05-07 ENCOUNTER — Ambulatory Visit (INDEPENDENT_AMBULATORY_CARE_PROVIDER_SITE_OTHER): Payer: Managed Care, Other (non HMO) | Admitting: Neurology

## 2013-05-07 ENCOUNTER — Encounter (INDEPENDENT_AMBULATORY_CARE_PROVIDER_SITE_OTHER): Payer: Self-pay

## 2013-05-07 ENCOUNTER — Encounter: Payer: Self-pay | Admitting: Neurology

## 2013-05-07 VITALS — BP 159/100 | HR 86 | Ht 60.0 in | Wt 229.0 lb

## 2013-05-07 DIAGNOSIS — E119 Type 2 diabetes mellitus without complications: Secondary | ICD-10-CM | POA: Insufficient documentation

## 2013-05-07 DIAGNOSIS — R251 Tremor, unspecified: Secondary | ICD-10-CM

## 2013-05-07 DIAGNOSIS — R259 Unspecified abnormal involuntary movements: Secondary | ICD-10-CM

## 2013-05-07 DIAGNOSIS — I10 Essential (primary) hypertension: Secondary | ICD-10-CM

## 2013-05-07 MED ORDER — CLONAZEPAM 0.5 MG PO TABS: 0.5000 mg | ORAL_TABLET | Freq: Two times a day (BID) | ORAL | Status: DC | PRN

## 2013-05-07 NOTE — Progress Notes (Signed)
[facility]  PATIENT: Courtney Cabrera DOB: November 07, 1960  HISTORICAL  Courtney Cabrera is a 53 years old right-handed African American female, referred by her primary care physician Sigmund Hazel for evaluation of head tremor  She has past medical history of diabetes, hypertension, there was no family history of tremor  It was a hard year for her, she had 3 deaths in the family, in 01-Dec-2014after her close friend died, she began to notice head tremor, lasting for one week, there was no hand tremor, few days ago, her niece passed away from car accident, she began to experience similar head tremor again, she became tearful during today's interview, I noticed she has mild head no-no shaking, mild voice tremor as well,  She denies gait difficulty, no loss sense of smell,  She complains of excessive stress, including job promotion, family responsibilities,  Labss cbc, cmp. TSH  REVIEW OF SYSTEMS: Full 14 system review of systems performed and notable only for eye pain, allergy  ALLERGIES: Allergies  Allergen Reactions  . Amlodipine     HOME MEDICATIONS: Outpatient Prescriptions Prior to Visit  Medication Sig Dispense Refill  . aspirin 81 MG tablet Take 81 mg by mouth daily.      . cholecalciferol (VITAMIN D) 1000 UNITS tablet Take 1,000 Units by mouth daily.      . ferrous sulfate 325 (65 FE) MG tablet Take 325 mg by mouth daily with breakfast.      . glimepiride (AMARYL) 4 MG tablet Take 4 mg by mouth daily before breakfast.      . metFORMIN (GLUCOPHAGE) 1000 MG tablet Take 1,000 mg by mouth 2 (two) times daily with a meal.      . Multiple Vitamins-Minerals (MULTIVITAMIN WITH MINERALS) tablet Take 1 tablet by mouth daily.      . valsartan (DIOVAN) 160 MG tablet Take 160 mg by mouth daily.      . vitamin B-12 (CYANOCOBALAMIN) 100 MCG tablet Take 50 mcg by mouth daily.         PAST MEDICAL HISTORY: Past Medical History  Diagnosis Date  . Diabetes  mellitus without complication   . Hypertension   . Tremor     PAST SURGICAL HISTORY: Past Surgical History  Procedure Laterality Date  . Cesarean section    . Abdominal hysterectomy    . Cholecystectomy    . Tonsillectomy      FAMILY HISTORY: Family History  Problem Relation Age of Onset  . Heart disease Father   . Pneumonia Mother   . Stroke Mother   . Dementia Mother     SOCIAL HISTORY:  History   Social History  . Marital Status: Married    Spouse Name: Iona Hansen    Number of Children: 2  . Years of Education: 13   Occupational History  .  [employer]   Social History Main Topics  . Smoking status: Never Smoker   . Smokeless tobacco: Never Used  . Alcohol Use: No  . Drug Use: No  . Sexual Activity: Not on file   Other Topics Concern  . Not on file   Social History Narrative   Patient works full time at [employer] [place].   Lives at home with her husband Catering manager)   Education- some college   Right handed.   Caffeine- one cup of coffee daily.     PHYSICAL EXAM   Filed Vitals:   05/07/13 0820  BP: 159/100  Pulse: 86  Height: 5' (1.524 m)  Weight: 229 lb (103.874 kg)     Body mass index is 44.72 kg/(m^2).   Generalized: In no acute distress  Neck: Supple, no carotid bruits   Cardiac: Regular rate rhythm  Pulmonary: Clear to auscultation bilaterally  Musculoskeletal: No deformity  Neurological examination  Mentation: Alert oriented to time, place, history taking, and causual conversation, .Occasionally head no-no shaking, small amplitude  Cranial nerve II-XII: Pupils were equal round reactive to light extraocular movements were full, Visual field were full on confrontational test. Bilateral fundi were sharp.Mild left conjunctiva erythematous.  Facial sensation and strength were normal. Hearing was intact to finger rubbing bilaterally. Uvula tongue midline.  head turning and shoulder shrug and were normal and symmetric.Tongue protrusion into  cheek strength was normal.  Motor: normal tone, bulk and strength  Sensory: Intact to fine touch, pinprick, preserved vibratory sensation, and proprioception at toes.  Coordination: Normal finger to nose, heel-to-shin bilaterally there was no truncal ataxia  Gait: Rising up from seated position without assistance, normal stance, without trunk ataxia, moderate stride, good arm swing, smooth turning, able to perform tiptoe, and heel walking without difficulty.   Romberg signs: Negative  Deep tendon reflexes: Brachioradialis 2/2, biceps 2/2, triceps 2/2, patellar 2/2, Achilles 2/2, plantar responses were flexor bilaterally.   DIAGNOSTIC DATA (LABS, IMAGING, TESTING) - I reviewed patient records, labs, notes, testing and imaging myself where available.  Lab Results  Component Value Date   WBC 10.4 12/06/2012   HGB 13.8 12/06/2012   HCT 37.9 12/06/2012   MCV 80.8 12/06/2012   PLT 321 12/06/2012      Component Value Date/Time   NA 137 12/06/2012 1639   K 3.2* 12/06/2012 1639   CL 98 12/06/2012 1639   CO2 27 12/06/2012 1639   GLUCOSE 151* 12/06/2012 1639   BUN 15 12/06/2012 1639   CREATININE 0.87 12/06/2012 1639   CALCIUM 9.7 12/06/2012 1639   PROT 8.9* 12/06/2012 1639   ALBUMIN 3.7 12/06/2012 1639   AST 22 12/06/2012 1639   ALT 29 12/06/2012 1639   ALKPHOS 88 12/06/2012 1639   BILITOT 0.2* 12/06/2012 1639   GFRNONAA 75* 12/06/2012 1639   GFRAA 87* 12/06/2012 1639    ASSESSMENT AND PLAN   [age] years old [place] female, experience intermittent head shaking after stressful situations, normal neurological examinations, there was no rigidity, bradykinesia, gait difficulty no family history of tremor,   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  she has normal thyroid function test  2.   clonazepam 0 point 5 mg as needed 3. She will only return to clinic if new issue arise   Levert FeinsteinYijun Garreth Burnsworth, M.D. Ph.D.  [facility] [address], [address] [place], [place]  [id number] [phone]

## 2013-11-05 ENCOUNTER — Ambulatory Visit: Payer: Managed Care, Other (non HMO) | Attending: Internal Medicine | Admitting: Occupational Therapy

## 2014-06-12 ENCOUNTER — Emergency Department (HOSPITAL_COMMUNITY)
Admission: EM | Admit: 2014-06-12 | Discharge: 2014-06-12 | Disposition: A | Discharge status: Home / Self Care | Payer: 59 | Attending: Emergency Medicine | Admitting: Emergency Medicine

## 2014-06-12 ENCOUNTER — Encounter (HOSPITAL_COMMUNITY): Payer: Self-pay | Admitting: *Deleted

## 2014-06-12 DIAGNOSIS — R42 Dizziness and giddiness: Secondary | ICD-10-CM | POA: Insufficient documentation

## 2014-06-12 DIAGNOSIS — R55 Syncope and collapse: Secondary | ICD-10-CM | POA: Insufficient documentation

## 2014-06-12 DIAGNOSIS — I10 Essential (primary) hypertension: Secondary | ICD-10-CM | POA: Insufficient documentation

## 2014-06-12 DIAGNOSIS — Z79899 Other long term (current) drug therapy: Secondary | ICD-10-CM | POA: Insufficient documentation

## 2014-06-12 DIAGNOSIS — Z7982 Long term (current) use of aspirin: Secondary | ICD-10-CM | POA: Insufficient documentation

## 2014-06-12 DIAGNOSIS — E119 Type 2 diabetes mellitus without complications: Secondary | ICD-10-CM | POA: Diagnosis not present

## 2014-06-12 LAB — BASIC METABOLIC PANEL
ANION GAP: 9 (ref 5–15)
BUN: 15 mg/dL (ref 6–23)
CALCIUM: 9.4 mg/dL (ref 8.4–10.5)
CHLORIDE: 99 mmol/L (ref 96–112)
CO2: 27 mmol/L (ref 19–32)
Creatinine, Ser: 1.01 mg/dL (ref 0.50–1.10)
GFR calc Af Amer: 72 mL/min — ABNORMAL LOW (ref >90)
GFR calc non Af Amer: 62 mL/min — ABNORMAL LOW (ref >90)
GLUCOSE: 173 mg/dL — AB (ref 70–99)
Potassium: 3.6 mmol/L (ref 3.5–5.1)
SODIUM: 135 mmol/L (ref 135–145)

## 2014-06-12 LAB — CBC
HEMATOCRIT: 38.4 % (ref 36.0–46.0)
Hemoglobin: 13.8 g/dL (ref 12.0–15.0)
MCH: 29.2 pg (ref 26.0–34.0)
MCHC: 35.9 g/dL (ref 30.0–36.0)
MCV: 81.2 fL (ref 78.0–100.0)
PLATELETS: 308 10*3/uL (ref 150–400)
RBC: 4.73 MIL/uL (ref 3.87–5.11)
RDW: 14.5 % (ref 11.5–15.5)
WBC: 11.6 10*3/uL — AB (ref 4.0–10.5)

## 2014-06-12 LAB — CBG MONITORING, ED: GLUCOSE-CAPILLARY: 152 mg/dL — AB (ref 70–99)

## 2014-06-12 LAB — TROPONIN I: Troponin I: 0.03 ng/mL (ref <0.031)

## 2014-06-12 NOTE — ED Notes (Signed)
Patient and husband received the discharge instructions. Verbalized understanding and used teach back method for care of a syncope episode. Work note was requested.

## 2014-06-12 NOTE — ED Notes (Signed)
Called Lab and had the troponin order added on.

## 2014-06-12 NOTE — ED Notes (Signed)
MD at bedside. 

## 2014-06-12 NOTE — Discharge Instructions (Signed)
Near-Syncope Near-syncope (commonly known as near fainting) is sudden weakness, dizziness, or feeling like you might pass out. During an episode of near-syncope, you may also develop pale skin, have tunnel vision, or feel sick to your stomach (nauseous). Near-syncope may occur when getting up after sitting or while standing for a long time. It is caused by a sudden decrease in blood flow to the brain. This decrease can result from various causes or triggers, most of which are not serious. However, because near-syncope can sometimes be a sign of something serious, a medical evaluation is required. The specific cause is often not determined. HOME CARE INSTRUCTIONS  Monitor your condition for any changes. The following actions may help to alleviate any discomfort you are experiencing:  Have someone stay with you until you feel stable.  Lie down right away and prop your feet up if you start feeling like you might faint. Breathe deeply and steadily. Wait until all the symptoms have passed. Most of these episodes last only a few minutes. You may feel tired for several hours.   Drink enough fluids to keep your urine clear or pale yellow.   If you are taking blood pressure or heart medicine, get up slowly when seated or lying down. Take several minutes to sit and then stand. This can reduce dizziness.  Follow up with your health care provider as directed. SEEK IMMEDIATE MEDICAL CARE IF:   You have a severe headache.   You have unusual pain in the chest, abdomen, or back.   You are bleeding from the mouth or rectum, or you have black or tarry stool.   You have an irregular or very fast heartbeat.   You have repeated fainting or have seizure-like jerking during an episode.   You faint when sitting or lying down.   You have confusion.   You have difficulty walking.   You have severe weakness.   You have vision problems.  MAKE SURE YOU:   Understand these instructions.  Will  watch your condition.  Will get help right away if you are not doing well or get worse. Document Released: 03/28/2005 Document Revised: 04/02/2013 Document Reviewed: 08/31/2012 ExitCare Patient Information 2015 [employer]. This information is not intended to replace advice given to you by your health care provider. Make sure you discuss any questions you have with your health care provider.  

## 2014-06-12 NOTE — ED Provider Notes (Signed)
CSN: [id number]     Arrival date & time 06/12/14  0221 History  This chart was scribed for Courtney Gaskins, MD by Richarda Overlie, ED Scribe. This patient was seen in room D35C/D35C and the patient's care was started 3:49 AM.     Chief Complaint  Patient presents with  . Near Syncope   Patient is a 54 y.o. female presenting with near-syncope. The history is provided by the patient. No language interpreter was used.  Near Syncope This is a new problem. The current episode started 1 to 2 hours ago. The problem has been rapidly improving. Pertinent negatives include no chest pain, no abdominal pain, no headaches and no shortness of breath. Nothing aggravates the symptoms. The symptoms are relieved by rest. She has tried nothing for the symptoms.   HPI Comments: Courtney Cabrera is a 54 y.o. female with a history of DM and HTN who presents to the Emergency Department complaining of near-syncope that occurred about 3 hours ago. Pt states that she started eating around 1AM and when she stood up she felt light-headed. She states that she told her son that she was not feeling well and says she was weak and sweating. Her husband reports that pt was talking in slurred speech and was falling in their son's arms. Pt states that she started feeling better after 15 minutes. Pt reports she has not started any new medication. She says that she recently increased her BP medication 2x a day. She denies any similar prior episodes. Pt reports no history of stroke, VTE/CAD/CHF. Pt denies seizures, CP, abdominal pain, back pain, SOB, diarrhea, blood in stool and HA.  No previous h/o syncope She denies vertigo No focal weakness reported  Past Medical History  Diagnosis Date  . Diabetes mellitus without complication   . Hypertension   . Tremor    Past Surgical History  Procedure Laterality Date  . Cesarean section    . Abdominal hysterectomy    . Cholecystectomy    . Tonsillectomy     Family History   Problem Relation Age of Onset  . Heart disease Father   . Pneumonia Mother   . Stroke Mother   . Dementia Mother    History  Substance Use Topics  . Smoking status: Never Smoker   . Smokeless tobacco: Never Used  . Alcohol Use: No   OB History    No data available     Review of Systems  Respiratory: Negative for shortness of breath.   Cardiovascular: Positive for near-syncope. Negative for chest pain.  Gastrointestinal: Negative for abdominal pain, diarrhea and blood in stool.  Neurological: Positive for light-headedness. Negative for seizures, syncope and headaches.  All other systems reviewed and are negative.  Allergies  Amlodipine  Home Medications   Prior to Admission medications   Medication Sig Start Date End Date Taking? Authorizing Provider  ACCU-CHEK AVIVA PLUS test strip  02/20/13   Historical Provider, MD  ACCU-CHEK FASTCLIX LANCETS MISC  02/20/13   Historical Provider, MD  aspirin 81 MG tablet Take 81 mg by mouth daily.    Historical Provider, MD  Black Cohosh 40 MG CAPS Take by mouth daily.    Historical Provider, MD  cholecalciferol (VITAMIN D) 1000 UNITS tablet Take 1,000 Units by mouth daily.    Historical Provider, MD  clonazePAM (KLONOPIN) 0.5 MG tablet Take 1 tablet (0.5 mg total) by mouth 2 (two) times daily as needed for anxiety. 05/07/13   Levert Feinstein, MD  ferrous  sulfate 325 (65 FE) MG tablet Take 325 mg by mouth daily with breakfast.    Historical Provider, MD  glimepiride (AMARYL) 4 MG tablet Take 4 mg by mouth daily before breakfast.    Historical Provider, MD  hydrochlorothiazide (MICROZIDE) 12.5 MG capsule Take 12.5 mg by mouth daily. 01/28/13   Historical Provider, MD  metFORMIN (GLUCOPHAGE) 1000 MG tablet Take 1,000 mg by mouth 2 (two) times daily with a meal.    Historical Provider, MD  Multiple Vitamins-Minerals (MULTIVITAMIN WITH MINERALS) tablet Take 1 tablet by mouth daily.    Historical Provider, MD  sertraline (ZOLOFT) 50 MG tablet Take 50  mg by mouth daily. 02/18/13   Historical Provider, MD  valsartan (DIOVAN) 160 MG tablet Take 160 mg by mouth daily.    Historical Provider, MD  valsartan-hydrochlorothiazide (DIOVAN-HCT) 320-12.5 MG per tablet Take 12.5 tablets by mouth daily. 03/04/13   Historical Provider, MD  verapamil (CALAN-SR) 120 MG CR tablet Take 120 mg by mouth daily. 03/04/13   Historical Provider, MD  vitamin B-12 (CYANOCOBALAMIN) 100 MCG tablet Take 50 mcg by mouth daily.    Historical Provider, MD   BP 140/77 mmHg  Pulse 72  Temp(Src) 98.2 F (36.8 C) (Oral)  Resp 18  Ht 5\' 1"  (1.549 m)  SpO2 99% Physical Exam CONSTITUTIONAL: Well developed/well nourished HEAD: Normocephalic/atraumatic EYES: EOMI/PERRL, no nystagmus ENMT: Mucous membranes moist NECK: supple no meningeal signs SPINE/BACK:entire spine nontender CV: S1/S2 noted, no murmurs/rubs/gallops noted LUNGS: Lungs are clear to auscultation bilaterally, no apparent distress ABDOMEN: soft, nontender, no rebound or guarding, bowel sounds noted throughout abdomen. No pulsatile abdominal mass.  GU:no cva tenderness NEURO: Pt is awake/alert/appropriate, moves all extremitiesx4.  No facial droop.   EXTREMITIES: pulses normal/equalx4, full ROM. No calf tenderness or edema.  SKIN: warm, color normal PSYCH: no abnormalities of mood noted, alert and oriented to situation  ED Course  Procedures   DIAGNOSTIC STUDIES: Oxygen Saturation is 99% on RA, normal by my interpretation.    COORDINATION OF CARE: 3:58 AM Discussed treatment plan with pt at bedside and pt agreed to plan.   EKG essentially unchanged from prior She is well appearing She is ambulatory She denies any complaints She never had CP/abd pain She had no focal weakness to suggest CVA Husband reported her speech was changed but this was during event and quickly resolved without any focal weakness.  Her history is more c/w near syncope rather than TIA/CVA She would like to go home Given no  h/o CAD/CHF this is reasonable (I did inform pt/family that can not r/o ACS with one troponin but they still want to go home) We discussed strict return precautions BP 132/80 mmHg  Pulse 76  Temp(Src) 97.9 F (36.6 C) (Oral)  Resp 16  Ht 5\' 1"  (1.549 m)  SpO2 96%  Labs Review Labs Reviewed  CBC - Abnormal; Notable for the following:    WBC 11.6 (*)    All other components within normal limits  BASIC METABOLIC PANEL - Abnormal; Notable for the following:    Glucose, Bld 173 (*)    GFR calc non Af Amer 62 (*)    GFR calc Af Amer 72 (*)    All other components within normal limits  CBG MONITORING, ED - Abnormal; Notable for the following:    Glucose-Capillary 152 (*)    All other components within normal limits  TROPONIN I    EKG Interpretation   Date/Time:  Thursday June 12 2014 02:27:35 EST Ventricular  Rate:  71 PR Interval:  248 QRS Duration: 94 QT Interval:  404 QTC Calculation: 439 R Axis:   66 Text Interpretation:  Sinus rhythm with 1st degree A-V block Cannot rule  out Anterior infarct , age undetermined Abnormal ECG No significant change  since last tracing Confirmed by Bebe Shaggy  MD, Demaurion Dicioccio ([id number]) on 06/12/2014  4:39:06 AM      MDM   Final diagnoses:  Near syncope    Nursing notes including past medical history and social history reviewed and considered in documentation Labs/vital reviewed myself and considered during evaluation   I personally performed the services described in this documentation, which was scribed in my presence. The recorded information has been reviewed and is accurate.       Courtney Gaskins, MD 06/12/14 [phone]

## 2014-06-12 NOTE — ED Notes (Signed)
Pt states she felt like she was going to fait around 0130. Pt called for EMS, EMS evaluated pt at house. Pt came to ED by POV. Pt states prior to EMS arrival pt felt weak, nauseated, and lightheaded. Pt denies any symptoms now.

## 2015-12-31 ENCOUNTER — Other Ambulatory Visit: Payer: Self-pay | Admitting: Oncology

## 2015-12-31 ENCOUNTER — Encounter: Payer: Self-pay | Admitting: Oncology

## 2016-01-19 ENCOUNTER — Other Ambulatory Visit: Payer: Self-pay | Admitting: Family Medicine

## 2016-01-19 DIAGNOSIS — Z1231 Encounter for screening mammogram for malignant neoplasm of breast: Secondary | ICD-10-CM

## 2016-02-02 ENCOUNTER — Ambulatory Visit
Admission: RE | Admit: 2016-02-02 | Discharge: 2016-02-02 | Disposition: A | Discharge status: Home / Self Care | Payer: Managed Care, Other (non HMO) | Source: Ambulatory Visit | Attending: Family Medicine | Admitting: Family Medicine

## 2016-02-02 DIAGNOSIS — Z1231 Encounter for screening mammogram for malignant neoplasm of breast: Secondary | ICD-10-CM

## 2016-03-10 ENCOUNTER — Other Ambulatory Visit: Payer: Self-pay | Admitting: Family Medicine

## 2016-03-10 ENCOUNTER — Other Ambulatory Visit (HOSPITAL_COMMUNITY)
Admission: RE | Admit: 2016-03-10 | Discharge: 2016-03-10 | Disposition: A | Discharge status: Home / Self Care | Payer: No Typology Code available for payment source | Source: Ambulatory Visit | Attending: Family Medicine | Admitting: Family Medicine

## 2016-03-10 DIAGNOSIS — Z124 Encounter for screening for malignant neoplasm of cervix: Secondary | ICD-10-CM | POA: Insufficient documentation

## 2016-03-14 LAB — CYTOLOGY - PAP: Diagnosis: NEGATIVE

## 2017-04-25 ENCOUNTER — Other Ambulatory Visit: Payer: Self-pay | Admitting: Family Medicine

## 2017-04-25 DIAGNOSIS — Z1231 Encounter for screening mammogram for malignant neoplasm of breast: Secondary | ICD-10-CM

## 2017-04-27 ENCOUNTER — Ambulatory Visit: Payer: No Typology Code available for payment source

## 2017-05-16 ENCOUNTER — Ambulatory Visit
Admission: RE | Admit: 2017-05-16 | Discharge: 2017-05-16 | Disposition: A | Discharge status: Home / Self Care | Payer: No Typology Code available for payment source | Source: Ambulatory Visit | Attending: Family Medicine | Admitting: Family Medicine

## 2017-05-16 DIAGNOSIS — Z1231 Encounter for screening mammogram for malignant neoplasm of breast: Secondary | ICD-10-CM

## 2017-09-12 ENCOUNTER — Encounter: Payer: Self-pay | Admitting: Neurology

## 2017-09-12 ENCOUNTER — Ambulatory Visit: Payer: No Typology Code available for payment source | Admitting: Neurology

## 2017-09-12 VITALS — BP 160/97 | HR 67 | Wt 218.5 lb

## 2017-09-12 DIAGNOSIS — R410 Disorientation, unspecified: Secondary | ICD-10-CM

## 2017-09-12 NOTE — Progress Notes (Signed)
PATIENT: Courtney Cabrera DOB: 10/19/1960  Chief Complaint  Patient presents with  . New Patient (Initial Visit)    Patient is here alone. Patient reports that she had an episode of vomiting, confusion, memory loss after eating a burger, this happened in February.      HISTORICAL  Courtney ChapelGeraldine L Bisson is a 57 years old female, seen in refer by her primary care physician Dr. Zachery DauerBarnes, Lanora ManisElizabeth, for evaluation of episodes of confusion, memory loss, initial evaluation was on September 12, 2017.    I reviewed and summarized the referring note, she has past medical history of hypertension, diabetes, went on diet since beginning of 2019, vegetarian diet, in February 2019, she went on the beach treatment, last meal was around noon, by 9 PM, felt jittery, shaky, similar to her hypoglycemia episode, she stopped for hamburger, shortly afterwards, she threw up all her stomach content, by 11 PM when she reached her sister's house, she felt strange, tired, took some cookies and then took her verapamil, Metformin, went to sleep, felt better next morning, she did not notice any difference in her dealing with people, but was told by her family, the next couple days," she is not herself", she talk with strangers that she does not know,asked him questions,  She is now back to her baseline, diabetes under suboptimal control, A1c was 7.7.  Laboratory evaluations on July 04, 2017 showed A1c 7.7, glucose 190, normal TSH, GFR was 59, normal CBC hemoglobin of 13.6,  REVIEW OF SYSTEMS: Full 14 system review of systems performed and notable only for confusion, memory loss, constipation  ALLERGIES: Allergies  Allergen Reactions  . Amlodipine Swelling    HOME MEDICATIONS: Current Outpatient Medications  Medication Sig Dispense Refill  . ACCU-CHEK AVIVA PLUS test strip     . ACCU-CHEK FASTCLIX LANCETS MISC     . aspirin 81 MG tablet Take 81 mg by mouth daily.    . cholecalciferol (VITAMIN D) 1000 UNITS tablet  Take 1,000 Units by mouth daily.    . ferrous sulfate 325 (65 FE) MG tablet Take 325 mg by mouth daily with breakfast.    . glimepiride (AMARYL) 2 MG tablet TK 1 T PO QD WITH BRE OR THE FIRST MAIN MEAL OF THE DAY  1  . hydrochlorothiazide (MICROZIDE) 12.5 MG capsule Take 12.5 mg by mouth daily.    . metFORMIN (GLUCOPHAGE) 500 MG tablet   0  . Multiple Vitamins-Minerals (MULTIVITAMIN WITH MINERALS) tablet Take 1 tablet by mouth daily.    . sertraline (ZOLOFT) 50 MG tablet Take 50 mg by mouth daily.    . valsartan-hydrochlorothiazide (DIOVAN-HCT) 320-12.5 MG per tablet Take 12.5 tablets by mouth daily.    . verapamil (CALAN-SR) 120 MG CR tablet Take 120 mg by mouth daily.    . vitamin B-12 (CYANOCOBALAMIN) 100 MCG tablet Take 50 mcg by mouth daily.     No current facility-administered medications for this visit.     PAST MEDICAL HISTORY: Past Medical History:  Diagnosis Date  . Diabetes mellitus without complication (HCC)   . Hypertension   . Tremor     PAST SURGICAL HISTORY: Past Surgical History:  Procedure Laterality Date  . ABDOMINAL HYSTERECTOMY    . BREAST EXCISIONAL BIOPSY    . CESAREAN SECTION    . CHOLECYSTECTOMY    . TONSILLECTOMY      FAMILY HISTORY: Family History  Problem Relation Age of Onset  . Pneumonia Mother   . Stroke Mother   .  Dementia Mother   . Breast cancer Mother   . Heart disease Father     SOCIAL HISTORY:  Social History   Socioeconomic History  . Marital status: Married    Spouse name: Iona Hansen  . Number of children: 2  . Years of education: 38  . Highest education level: Not on file  Occupational History    Employer: [employer]  Social Needs  . Financial resource strain: Not on file  . Food insecurity:    Worry: Not on file    Inability: Not on file  . Transportation needs:    Medical: Not on file    Non-medical: Not on file  Tobacco Use  . Smoking status: Never Smoker  . Smokeless tobacco: Never Used  Substance and Sexual  Activity  . Alcohol use: No  . Drug use: No  . Sexual activity: Not on file  Lifestyle  . Physical activity:    Days per week: Not on file    Minutes per session: Not on file  . Stress: Not on file  Relationships  . Social connections:    Talks on phone: Not on file    Gets together: Not on file    Attends religious service: Not on file    Active member of club or organization: Not on file    Attends meetings of clubs or organizations: Not on file    Relationship status: Not on file  . Intimate partner violence:    Fear of current or ex partner: Not on file    Emotionally abused: Not on file    Physically abused: Not on file    Forced sexual activity: Not on file  Other Topics Concern  . Not on file  Social History Narrative   Patient works full time at [employer] [place].   Lives at home with her husband Catering manager)   Education- some college   Right handed.   Caffeine- one cup of coffee daily.     PHYSICAL EXAM   Vitals:   09/12/17 1029  BP: (!) 160/97  Pulse: 67  Weight: 218 lb 8 oz (99.1 kg)    Not recorded      Body mass index is 41.29 kg/m.  PHYSICAL EXAMNIATION:  Gen: NAD, conversant, well nourised, obese, well groomed                     Cardiovascular: Regular rate rhythm, no peripheral edema, warm, nontender. Eyes: Conjunctivae clear without exudates or hemorrhage Neck: Supple, no carotid bruits. Pulmonary: Clear to auscultation bilaterally   NEUROLOGICAL EXAM:  MENTAL STATUS: Speech:    Speech is normal; fluent and spontaneous with normal comprehension.  Cognition:     Orientation to time, place and person     Normal recent and remote memory     Normal Attention span and concentration     Normal Language, naming, repeating,spontaneous speech     Fund of knowledge   CRANIAL NERVES: CN II: Visual fields are full to confrontation. Fundoscopic exam is normal with sharp discs and no vascular changes. Pupils are round equal and briskly reactive to  light. CN III, IV, VI: extraocular movement are normal. No ptosis. CN V: Facial sensation is intact to pinprick in all 3 divisions bilaterally. Corneal responses are intact.  CN VII: Face is symmetric with normal eye closure and smile. CN VIII: Hearing is normal to rubbing fingers CN IX, X: Palate elevates symmetrically. Phonation is normal. CN XI: Head turning and  shoulder shrug are intact CN XII: Tongue is midline with normal movements and no atrophy.  MOTOR: There is no pronator drift of out-stretched arms. Muscle bulk and tone are normal. Muscle strength is normal.  REFLEXES: Reflexes are 2+ and symmetric at the biceps, triceps, knees, and ankles. Plantar responses are flexor.  SENSORY: Intact to light touch, pinprick, positional sensation and vibratory sensation are intact in fingers and toes.  COORDINATION: Rapid alternating movements and fine finger movements are intact. There is no dysmetria on finger-to-nose and heel-knee-shin.    GAIT/STANCE: Posture is normal. Gait is steady with normal steps, base, arm swing, and turning. Heel and toe walking are normal. Tandem gait is normal.  Romberg is absent.   DIAGNOSTIC DATA (LABS, IMAGING, TESTING) - I reviewed patient records, labs, notes, testing and imaging myself where available.   ASSESSMENT AND PLAN  CRUZ DEVILLA is a 57 y.o. female   One episode of confusion in February 2019  It is possible related to hypoglycemia,  Optimize control of her diabetes, frequent blood glucose check  MRI of the brain without contrast  EEG   Levert Feinstein, M.D. Ph.D.  [facility] [address] [place], [place] [address] Ph: [phone] Fax: [phone]  CC: Referring Provider

## 2017-09-13 ENCOUNTER — Telehealth: Payer: Self-pay | Admitting: Neurology

## 2017-09-13 NOTE — Telephone Encounter (Signed)
[employer] [place] Ref # [id number] order sent to [facility]. They will reach out to the pt to schedule.

## 2017-09-14 ENCOUNTER — Ambulatory Visit: Payer: BLUE CROSS/BLUE SHIELD | Admitting: Neurology

## 2017-09-14 DIAGNOSIS — R410 Disorientation, unspecified: Secondary | ICD-10-CM

## 2017-09-22 NOTE — Procedures (Signed)
   HISTORY: 57 year old female, with episode of confusion and memory loss  TECHNIQUE:  16 channel EEG was performed based on standard 10-16 international system. One channel was dedicated to EKG, which has demonstrates normal sinus rhythm of 80 beats per minutes.  Upon awakening, the posterior background activity was well-developed, in alpha range, 10 Hz, reactive to eye opening and closure.  There was no evidence of epileptiform discharge.  Photic stimulation was performed, which induced a symmetric photic driving.  Hyperventilation was performed, there was no abnormality elicit.  No sleep was achieved.  CONCLUSION: This is a  normal awake EEG.  There is no electrodiagnostic evidence of epileptiform discharge.  Levert FeinsteinYijun Shatiqua Heroux, M.D. Ph.D.  [facility] [address] [place], [place] [address] Phone: [phone] Fax:      [phone]

## 2018-06-30 ENCOUNTER — Ambulatory Visit (INDEPENDENT_AMBULATORY_CARE_PROVIDER_SITE_OTHER): Payer: BLUE CROSS/BLUE SHIELD

## 2018-06-30 ENCOUNTER — Ambulatory Visit (HOSPITAL_COMMUNITY)
Admission: EM | Admit: 2018-06-30 | Discharge: 2018-06-30 | Disposition: A | Discharge status: Home / Self Care | Payer: BLUE CROSS/BLUE SHIELD | Attending: Family Medicine | Admitting: Family Medicine

## 2018-06-30 ENCOUNTER — Encounter (HOSPITAL_COMMUNITY): Payer: Self-pay

## 2018-06-30 ENCOUNTER — Other Ambulatory Visit: Payer: Self-pay

## 2018-06-30 DIAGNOSIS — S99912A Unspecified injury of left ankle, initial encounter: Secondary | ICD-10-CM | POA: Diagnosis not present

## 2018-06-30 DIAGNOSIS — M25572 Pain in left ankle and joints of left foot: Secondary | ICD-10-CM

## 2018-06-30 DIAGNOSIS — S93402A Sprain of unspecified ligament of left ankle, initial encounter: Secondary | ICD-10-CM

## 2018-06-30 MED ORDER — IBUPROFEN 800 MG PO TABS
800.0000 mg | ORAL_TABLET | Freq: Once | ORAL | Status: AC
  Administered 2018-06-30: 800 mg via ORAL

## 2018-06-30 MED ORDER — IBUPROFEN 800 MG PO TABS
ORAL_TABLET | ORAL | Status: AC
  Filled 2018-06-30: qty 1

## 2018-06-30 NOTE — Discharge Instructions (Addendum)
You have been diagnosed with an ankle sprain today. Please wear the ankle brace provided for the next week. If crutches were provided, please use them to remain non weight bearing for the next couple of days. After this, you may gradually begin to bear weight as tolerated. If possible, elevate your ankle when seated. Applying ice for 20 minutes at a time every hour as needed may help with any pain for swelling you may have. You may also  take Ibuprofen 3 times daily for pain and inflammation. Follow up with your doctor or an orthopaedist in 1 week if you are not seeing significant improvement.

## 2018-06-30 NOTE — ED Triage Notes (Signed)
Pt presents today with left ankle pain. States that while she was on her lunch break she walked across the street to get something to eat and fell off a curb today. Hurt her left ankle.

## 2018-06-30 NOTE — ED Provider Notes (Signed)
[facility]   [id number] 06/30/18 Arrival Time: 1201  ASSESSMENT & PLAN:  1. Ankle injury, left, initial encounter    I have personally viewed the imaging studies ordered this visit. No fractures/dislocations seen.  Meds ordered this encounter  Medications  . ibuprofen (ADVIL,MOTRIN) tablet 800 mg   Orders Placed This Encounter  Procedures  . DG Ankle Complete Left  . Apply ASO ankle  . Crutches   Rest the injured area as much as practical.  Natural history and expected course discussed. Questions answered. Rest, ice, compression, elevation (RICE) therapy. Crutches and instructions provided. Transport planner distributed. Fit with ankle brace for use over next 1 week.  Prefers OTC analgesics. Declines pain medication. Work note provided.  Reviewed expectations re: course of current medical issues. Questions answered. Outlined signs and symptoms indicating need for more acute intervention. Patient verbalized understanding. After Visit Summary given.  SUBJECTIVE: History from: patient. Courtney Cabrera is a 58 y.o. female who reports persistent moderate pain of her left ankle; described as aching without radiation. Onset: abrupt, today. Injury/trama: yes, reports tripping/falling over curb; immediate ankle pain; difficulty bearing weight. Symptoms have progressed to a point and plateaued since beginning. Aggravating factors: movement/weight-bearing. Alleviating factors: rest. Associated symptoms: none reported. Extremity sensation changes or weakness: none. Self treatment: has not tried OTCs for relief of pain. History of similar: no. No back pain.  Past Surgical History:  Procedure Laterality Date  . ABDOMINAL HYSTERECTOMY    . BREAST EXCISIONAL BIOPSY    . CESAREAN SECTION    . CHOLECYSTECTOMY    . TONSILLECTOMY       ROS: As per HPI. All other systems negative.  OBJECTIVE:  Vitals:   06/30/18 1222  BP: (!) 143/82  Pulse: 79   Resp: 16  Temp: 98.6 F (37 C)  TempSrc: Oral  SpO2: 98%    General appearance: alert; no distress HEENT: [place]; AT Neck: supple with FROM Extremities: . LLE: warm and well perfused; poorly localized marked tenderness over left medial ankle; without gross deformities; with mild swelling; with no bruising; ROM: limited by pain; unable/unwilling to bear weight here CV: brisk extremity capillary refill of LLE  Back: no lumbar TTP Skin: warm and dry; no visible rashes Neurologic: will not attempt to bear weight on LLE; normal reflexes of RLE and LLE; normal sensation of RLE and LLE; normal strength of RLE and LLE Psychological: alert and cooperative; normal mood and affect   Allergies  Allergen Reactions  . Amlodipine Swelling    Past Medical History:  Diagnosis Date  . Diabetes mellitus without complication (HCC)   . Hypertension   . Tremor    Social History   Socioeconomic History  . Marital status: Married    Spouse name: Iona Hansen  . Number of children: 2  . Years of education: 27  . Highest education level: Not on file  Occupational History    Employer: [employer]  Social Needs  . Financial resource strain: Not on file  . Food insecurity:    Worry: Not on file    Inability: Not on file  . Transportation needs:    Medical: Not on file    Non-medical: Not on file  Tobacco Use  . Smoking status: Never Smoker  . Smokeless tobacco: Never Used  Substance and Sexual Activity  . Alcohol use: No  . Drug use: No  . Sexual activity: Not on file  Lifestyle  . Physical activity:    Days per week:  Not on file    Minutes per session: Not on file  . Stress: Not on file  Relationships  . Social connections:    Talks on phone: Not on file    Gets together: Not on file    Attends religious service: Not on file    Active member of club or organization: Not on file    Attends meetings of clubs or organizations: Not on file    Relationship status: Not on file  Other  Topics Concern  . Not on file  Social History Narrative   Patient works full time at [employer] [place].   Lives at home with her husband Catering manager)   Education- some college   Right handed.   Caffeine- one cup of coffee daily.   Family History  Problem Relation Age of Onset  . Pneumonia Mother   . Stroke Mother   . Dementia Mother   . Breast cancer Mother   . Heart disease Father    Past Surgical History:  Procedure Laterality Date  . ABDOMINAL HYSTERECTOMY    . BREAST EXCISIONAL BIOPSY    . CESAREAN SECTION    . CHOLECYSTECTOMY    . Greig Right, MD 07/03/18 1102

## 2019-03-13 ENCOUNTER — Other Ambulatory Visit: Payer: Self-pay

## 2019-03-13 DIAGNOSIS — Z20822 Contact with and (suspected) exposure to covid-19: Secondary | ICD-10-CM

## 2019-03-15 LAB — NOVEL CORONAVIRUS, NAA: SARS-CoV-2, NAA: DETECTED — AB

## 2019-03-16 ENCOUNTER — Telehealth: Payer: Self-pay | Admitting: Nurse Practitioner

## 2019-03-16 NOTE — Telephone Encounter (Signed)
Call placed to discuss with patient about Covid symptoms and the use of bamlanivimab, a monoclonal antibody infusion for those with mild to moderate Covid symptoms and at a high risk of hospitalization.  Pt is not qualified for this infusion at the [facility] infusion center at this time due to know symptoms being present.  She was tested as there were cases at her work.  At baseline has allergies and reports these symptoms have not changed or worsened.  HasT2DM, HTN which were addressed with the patient and are actively being managed by a primary care provider.    Alerted patient to her Covid positive status, as she had not been made aware.  Discussed quarantine guidelines with her.  She was able to verbalize these back.  Educated her that if symptoms do present to notify her PCP and if any SOB or difficulty breathing to immediately go to ER.

## 2021-08-09 ENCOUNTER — Other Ambulatory Visit: Payer: Self-pay | Admitting: Internal Medicine

## 2021-08-09 DIAGNOSIS — Z1231 Encounter for screening mammogram for malignant neoplasm of breast: Secondary | ICD-10-CM

## 2021-08-17 ENCOUNTER — Other Ambulatory Visit: Payer: Self-pay | Admitting: Internal Medicine

## 2021-08-17 DIAGNOSIS — Z1231 Encounter for screening mammogram for malignant neoplasm of breast: Secondary | ICD-10-CM

## 2021-08-19 ENCOUNTER — Ambulatory Visit: Payer: BLUE CROSS/BLUE SHIELD

## 2021-09-02 ENCOUNTER — Ambulatory Visit (INDEPENDENT_AMBULATORY_CARE_PROVIDER_SITE_OTHER): Payer: 59

## 2021-09-02 DIAGNOSIS — Z1231 Encounter for screening mammogram for malignant neoplasm of breast: Secondary | ICD-10-CM | POA: Diagnosis not present

## 2021-09-02 DIAGNOSIS — Z8742 Personal history of other diseases of the female genital tract: Secondary | ICD-10-CM

## 2022-03-28 DIAGNOSIS — R52 Pain, unspecified: Secondary | ICD-10-CM | POA: Diagnosis not present

## 2022-03-28 DIAGNOSIS — U071 COVID-19: Secondary | ICD-10-CM | POA: Diagnosis not present

## 2022-03-31 DIAGNOSIS — M25512 Pain in left shoulder: Secondary | ICD-10-CM | POA: Diagnosis not present

## 2022-03-31 DIAGNOSIS — M67912 Unspecified disorder of synovium and tendon, left shoulder: Secondary | ICD-10-CM | POA: Diagnosis not present

## 2022-08-27 DIAGNOSIS — E119 Type 2 diabetes mellitus without complications: Secondary | ICD-10-CM | POA: Diagnosis not present

## 2022-08-27 DIAGNOSIS — F419 Anxiety disorder, unspecified: Secondary | ICD-10-CM | POA: Diagnosis not present

## 2022-08-27 DIAGNOSIS — I10 Essential (primary) hypertension: Secondary | ICD-10-CM | POA: Diagnosis not present

## 2022-08-27 DIAGNOSIS — Z833 Family history of diabetes mellitus: Secondary | ICD-10-CM | POA: Diagnosis not present

## 2022-08-27 DIAGNOSIS — Z8249 Family history of ischemic heart disease and other diseases of the circulatory system: Secondary | ICD-10-CM | POA: Diagnosis not present

## 2022-08-27 DIAGNOSIS — Z8673 Personal history of transient ischemic attack (TIA), and cerebral infarction without residual deficits: Secondary | ICD-10-CM | POA: Diagnosis not present

## 2022-08-27 DIAGNOSIS — Z7984 Long term (current) use of oral hypoglycemic drugs: Secondary | ICD-10-CM | POA: Diagnosis not present

## 2022-08-27 DIAGNOSIS — Z888 Allergy status to other drugs, medicaments and biological substances status: Secondary | ICD-10-CM | POA: Diagnosis not present

## 2022-08-27 DIAGNOSIS — Z6841 Body Mass Index (BMI) 40.0 and over, adult: Secondary | ICD-10-CM | POA: Diagnosis not present

## 2022-12-14 IMAGING — MG MM DIGITAL SCREENING BILAT W/ TOMO AND CAD
8 series · 8 of 24 positions shown · non-contrast
Comparison: Previous exam(s).

CLINICAL DATA: Screening. History of RIGHT breast cysts.

EXAM:
DIGITAL SCREENING BILATERAL MAMMOGRAM WITH TOMOSYNTHESIS AND CAD
TECHNIQUE: Bilateral screening digital craniocaudal and mediolateral oblique
mammograms were obtained. Bilateral screening digital breast
tomosynthesis was performed. The images were evaluated with
computer-aided detection.

[R MLO synth-2D]
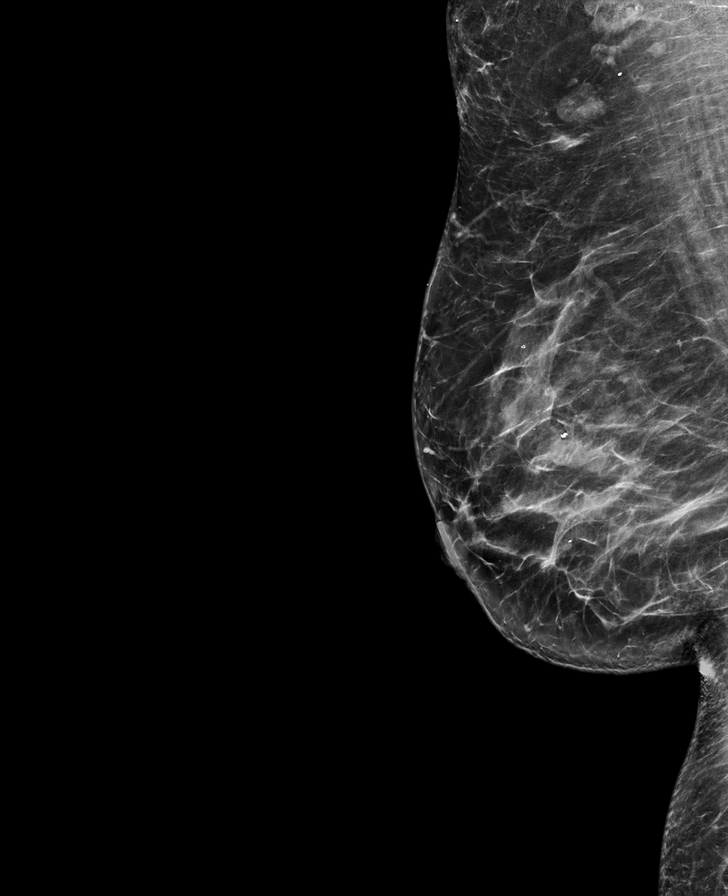

[L CC synth-2D]
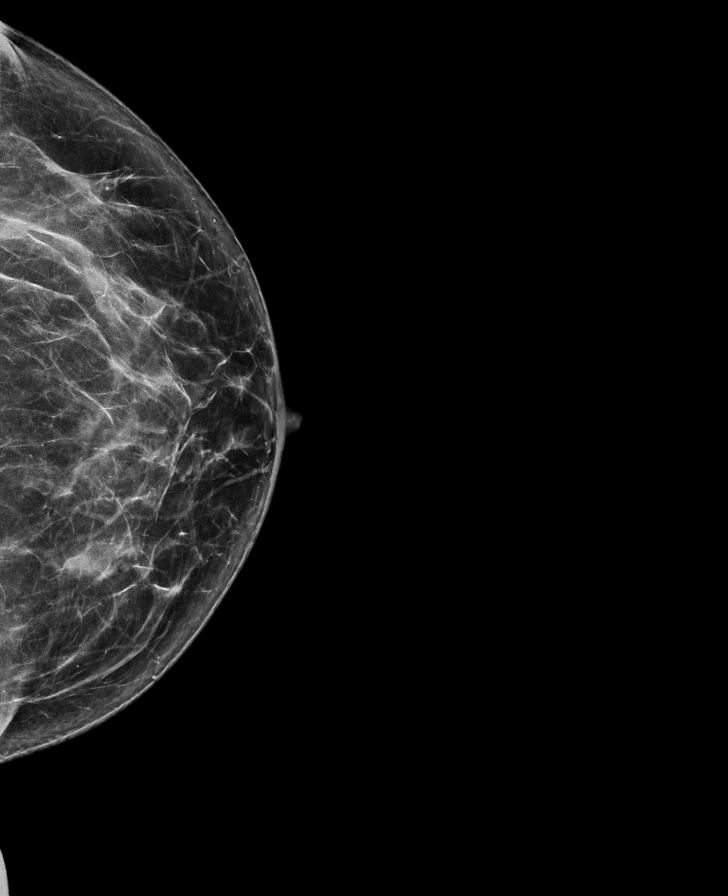

[L MLO synth-2D]
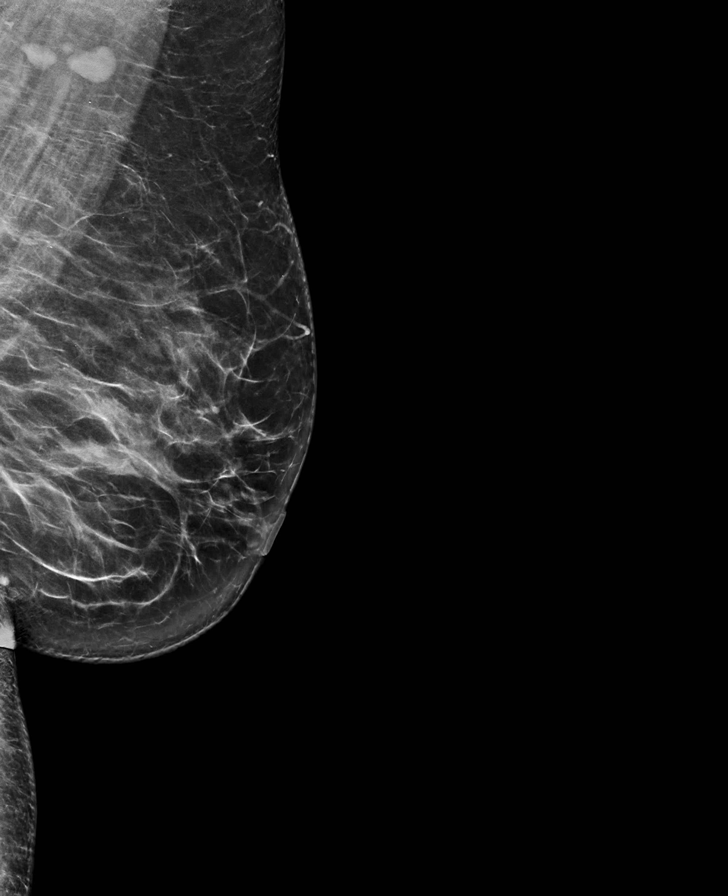

[R CC synth-2D]
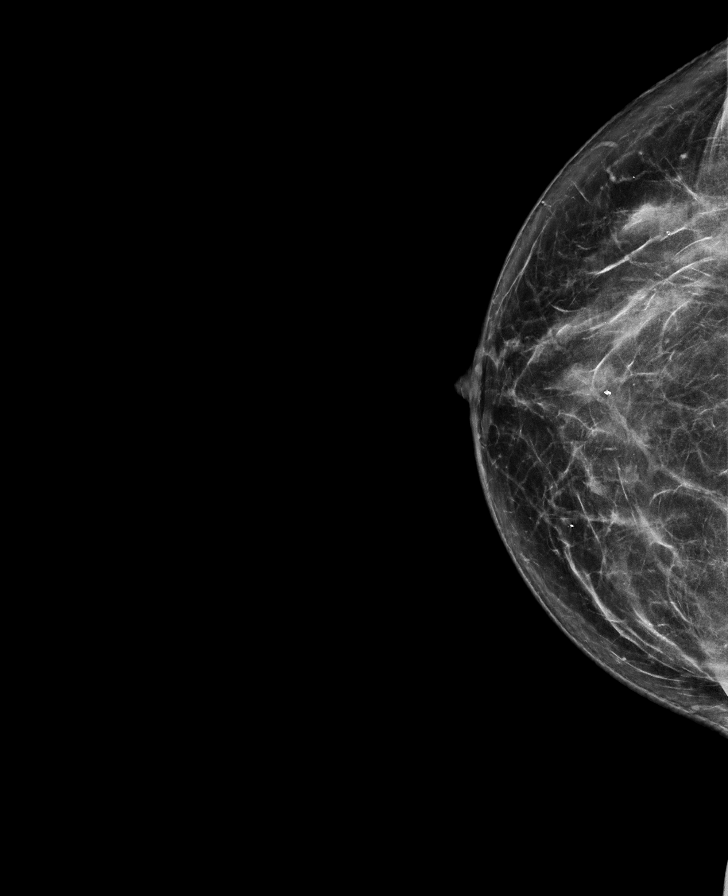

[L CC tomo · tomo slice 36/71.0]
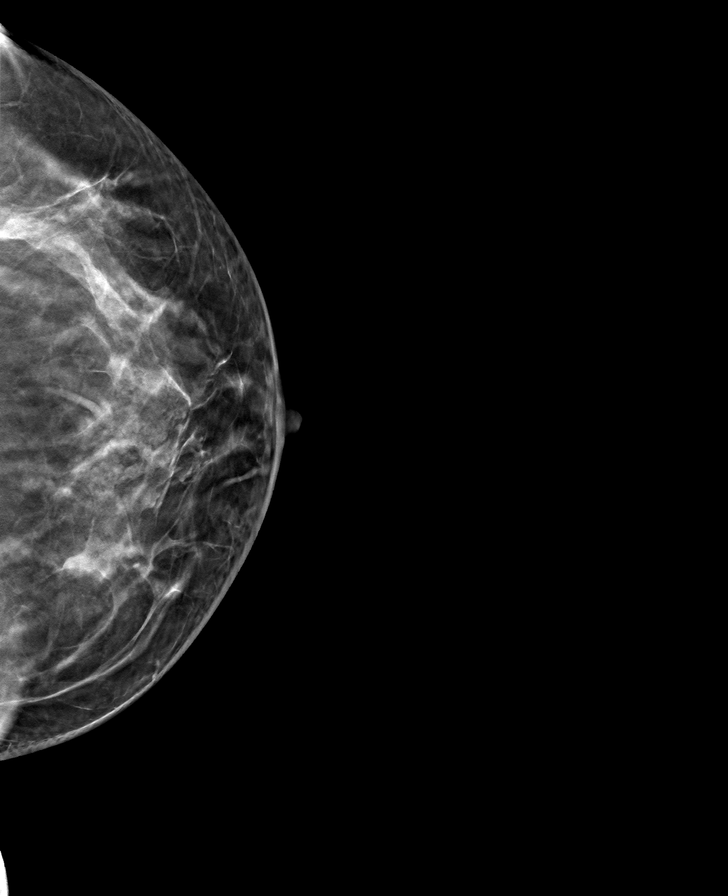

[L MLO tomo · tomo slice 39/78.0]
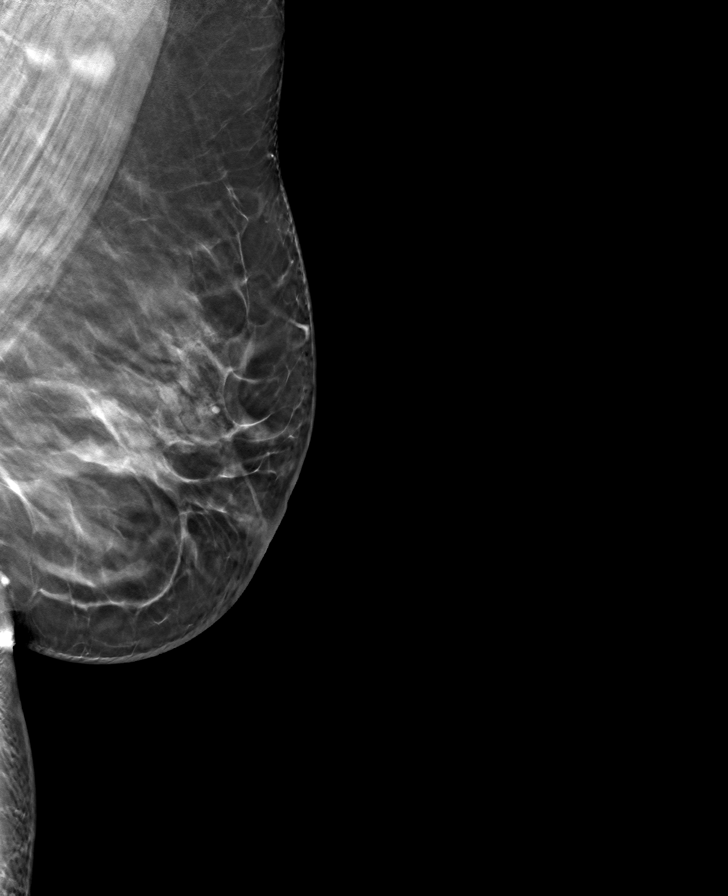

[R MLO tomo · tomo slice 39/76.0]
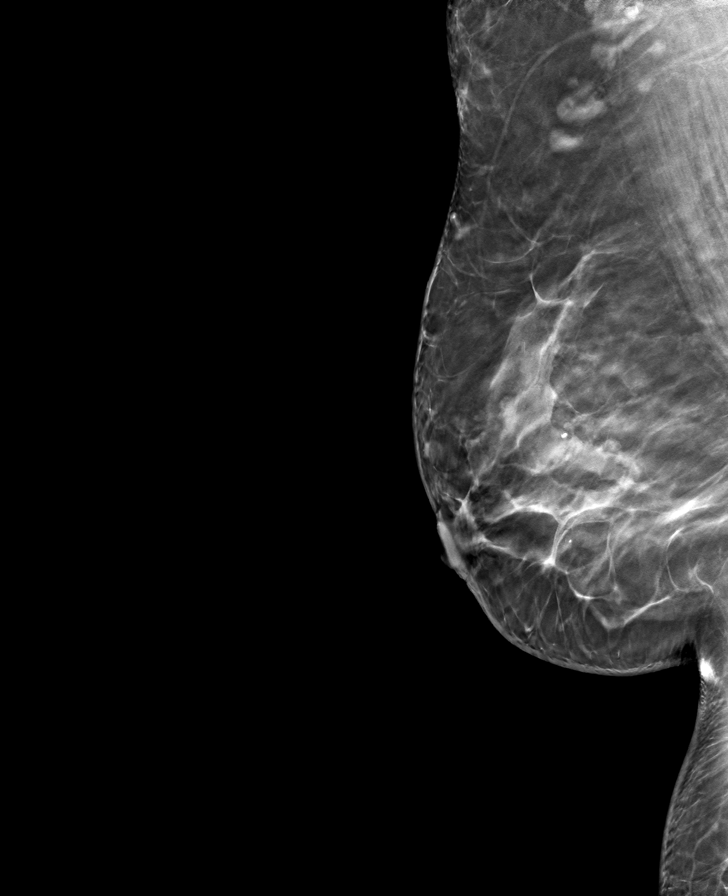

[R CC tomo · tomo slice 35/69.0]
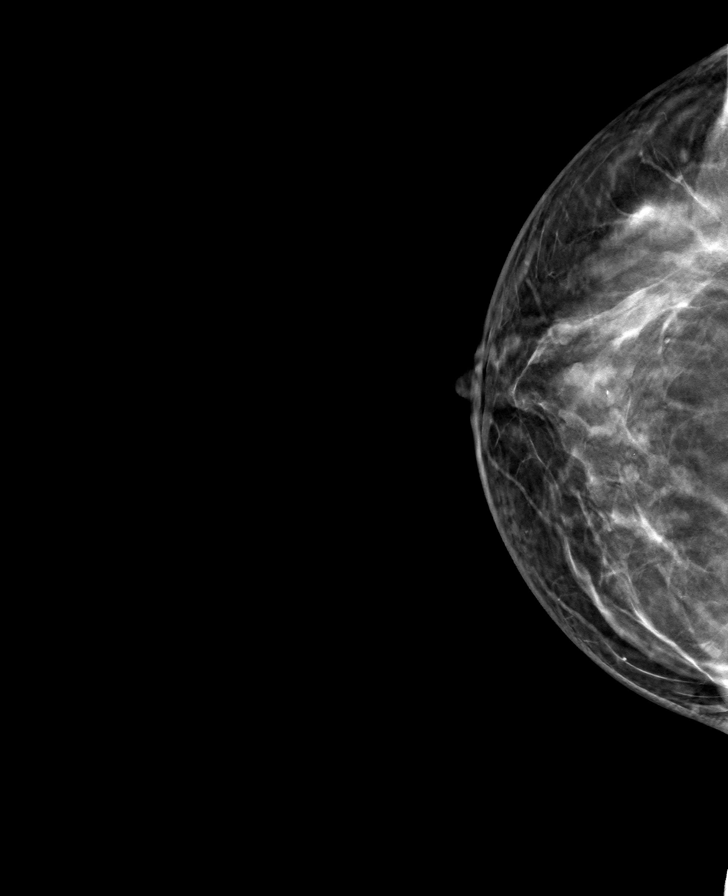

[8 of 24 positions shown; findings below may reference images not displayed]

ACR Breast Density Category c: The breast tissue is heterogeneously
dense, which may obscure small masses.
FINDINGS: There are no findings suspicious for malignancy.
IMPRESSION: No mammographic evidence of malignancy. A result letter of this
screening mammogram will be mailed directly to the patient.

RECOMMENDATION:
Screening mammogram in one year. (Code:SL-A-AQL)

BI-RADS CATEGORY  1: Negative.

## 2022-12-19 DIAGNOSIS — U071 COVID-19: Secondary | ICD-10-CM | POA: Diagnosis not present

## 2023-05-12 DIAGNOSIS — K5900 Constipation, unspecified: Secondary | ICD-10-CM | POA: Diagnosis not present

## 2023-07-14 ENCOUNTER — Other Ambulatory Visit: Payer: Self-pay | Admitting: Internal Medicine

## 2023-07-14 DIAGNOSIS — E1129 Type 2 diabetes mellitus with other diabetic kidney complication: Secondary | ICD-10-CM | POA: Diagnosis not present

## 2023-07-14 DIAGNOSIS — Z1231 Encounter for screening mammogram for malignant neoplasm of breast: Secondary | ICD-10-CM

## 2023-07-14 DIAGNOSIS — Z0000 Encounter for general adult medical examination without abnormal findings: Secondary | ICD-10-CM | POA: Diagnosis not present

## 2023-07-14 DIAGNOSIS — E7800 Pure hypercholesterolemia, unspecified: Secondary | ICD-10-CM | POA: Diagnosis not present

## 2023-09-18 DIAGNOSIS — K648 Other hemorrhoids: Secondary | ICD-10-CM | POA: Diagnosis not present

## 2023-09-18 DIAGNOSIS — Z800 Family history of malignant neoplasm of digestive organs: Secondary | ICD-10-CM | POA: Diagnosis not present

## 2023-09-18 DIAGNOSIS — D122 Benign neoplasm of ascending colon: Secondary | ICD-10-CM | POA: Diagnosis not present

## 2023-09-18 DIAGNOSIS — Z09 Encounter for follow-up examination after completed treatment for conditions other than malignant neoplasm: Secondary | ICD-10-CM | POA: Diagnosis not present

## 2023-09-18 DIAGNOSIS — Z860101 Personal history of adenomatous and serrated colon polyps: Secondary | ICD-10-CM | POA: Diagnosis not present

## 2023-12-16 DIAGNOSIS — B372 Candidiasis of skin and nail: Secondary | ICD-10-CM | POA: Diagnosis not present

## 2023-12-25 DIAGNOSIS — I10 Essential (primary) hypertension: Secondary | ICD-10-CM | POA: Diagnosis not present

## 2023-12-25 DIAGNOSIS — L304 Erythema intertrigo: Secondary | ICD-10-CM | POA: Diagnosis not present

## 2024-02-29 DIAGNOSIS — B349 Viral infection, unspecified: Secondary | ICD-10-CM | POA: Diagnosis not present

## 2024-02-29 DIAGNOSIS — R051 Acute cough: Secondary | ICD-10-CM | POA: Diagnosis not present

## 2024-02-29 DIAGNOSIS — Z03818 Encounter for observation for suspected exposure to other biological agents ruled out: Secondary | ICD-10-CM | POA: Diagnosis not present

## 2024-02-29 DIAGNOSIS — J029 Acute pharyngitis, unspecified: Secondary | ICD-10-CM | POA: Diagnosis not present

## 2024-02-29 DIAGNOSIS — R0981 Nasal congestion: Secondary | ICD-10-CM | POA: Diagnosis not present

## 2024-03-26 DIAGNOSIS — R059 Cough, unspecified: Secondary | ICD-10-CM | POA: Diagnosis not present
